# Patient Record
Sex: Female | Born: 1955 | Race: Black or African American | Hispanic: No | Marital: Married | Smoking: Never smoker
Health system: Southern US, Community
[De-identification: ages and names within clinical notes are randomized; demographics above are authoritative.]

## PROBLEM LIST (undated history)

## (undated) DIAGNOSIS — I517 Cardiomegaly: Secondary | ICD-10-CM

## (undated) DIAGNOSIS — J45909 Unspecified asthma, uncomplicated: Secondary | ICD-10-CM

## (undated) DIAGNOSIS — Z87898 Personal history of other specified conditions: Secondary | ICD-10-CM

## (undated) DIAGNOSIS — K219 Gastro-esophageal reflux disease without esophagitis: Secondary | ICD-10-CM

## (undated) DIAGNOSIS — G8929 Other chronic pain: Secondary | ICD-10-CM

## (undated) DIAGNOSIS — I10 Essential (primary) hypertension: Secondary | ICD-10-CM

## (undated) DIAGNOSIS — D649 Anemia, unspecified: Secondary | ICD-10-CM

## (undated) DIAGNOSIS — E669 Obesity, unspecified: Secondary | ICD-10-CM

## (undated) DIAGNOSIS — K8020 Calculus of gallbladder without cholecystitis without obstruction: Secondary | ICD-10-CM

## (undated) DIAGNOSIS — E785 Hyperlipidemia, unspecified: Secondary | ICD-10-CM

## (undated) DIAGNOSIS — R1013 Epigastric pain: Principal | ICD-10-CM

## (undated) HISTORY — PX: KNEE ARTHROSCOPY: SUR90

## (undated) HISTORY — DX: Other chronic pain: G89.29

## (undated) HISTORY — DX: Cardiomegaly: I51.7

## (undated) HISTORY — DX: Personal history of other specified conditions: Z87.898

## (undated) HISTORY — DX: Epigastric pain: R10.13

## (undated) HISTORY — PX: ABDOMINAL HYSTERECTOMY: SHX81

## (undated) HISTORY — DX: Obesity, unspecified: E66.9

## (undated) HISTORY — DX: Hyperlipidemia, unspecified: E78.5

## (undated) HISTORY — DX: Unspecified asthma, uncomplicated: J45.909

## (undated) HISTORY — PX: KNEE SURGERY: SHX244

## (undated) HISTORY — DX: Essential (primary) hypertension: I10

## (undated) HISTORY — DX: Calculus of gallbladder without cholecystitis without obstruction: K80.20

## (undated) HISTORY — PX: TONSILLECTOMY: SUR1361

## (undated) HISTORY — PX: APPENDECTOMY: SHX54

---

## 1988-10-13 HISTORY — PX: ABDOMINAL HYSTERECTOMY: SHX81

## 1999-02-11 ENCOUNTER — Other Ambulatory Visit: Admission: RE | Admit: 1999-02-11 | Discharge: 1999-02-11 | Payer: Self-pay | Admitting: *Deleted

## 1999-03-22 ENCOUNTER — Encounter: Payer: Self-pay | Admitting: *Deleted

## 1999-03-22 ENCOUNTER — Ambulatory Visit (HOSPITAL_COMMUNITY): Admission: RE | Admit: 1999-03-22 | Discharge: 1999-03-22 | Payer: Self-pay | Admitting: *Deleted

## 2002-07-25 ENCOUNTER — Ambulatory Visit (HOSPITAL_COMMUNITY): Admission: RE | Admit: 2002-07-25 | Discharge: 2002-07-25 | Payer: Self-pay | Admitting: Family Medicine

## 2002-07-25 ENCOUNTER — Encounter: Payer: Self-pay | Admitting: Family Medicine

## 2003-12-05 ENCOUNTER — Encounter: Admission: RE | Admit: 2003-12-05 | Discharge: 2003-12-05 | Payer: Self-pay | Admitting: Family Medicine

## 2004-06-26 ENCOUNTER — Emergency Department (HOSPITAL_COMMUNITY): Admission: EM | Admit: 2004-06-26 | Discharge: 2004-06-26 | Payer: Self-pay | Admitting: Emergency Medicine

## 2006-07-07 ENCOUNTER — Ambulatory Visit (HOSPITAL_COMMUNITY): Admission: RE | Admit: 2006-07-07 | Discharge: 2006-07-07 | Payer: Self-pay | Admitting: Internal Medicine

## 2008-01-19 ENCOUNTER — Encounter: Admission: RE | Admit: 2008-01-19 | Discharge: 2008-01-19 | Payer: Self-pay | Admitting: Obstetrics and Gynecology

## 2009-05-13 ENCOUNTER — Emergency Department (HOSPITAL_COMMUNITY): Admission: EM | Admit: 2009-05-13 | Discharge: 2009-05-13 | Payer: Self-pay | Admitting: Emergency Medicine

## 2010-11-03 ENCOUNTER — Encounter: Payer: Self-pay | Admitting: Orthopedic Surgery

## 2010-11-03 ENCOUNTER — Encounter: Payer: Self-pay | Admitting: Obstetrics and Gynecology

## 2011-02-04 ENCOUNTER — Other Ambulatory Visit: Payer: Self-pay | Admitting: Obstetrics and Gynecology

## 2011-02-04 ENCOUNTER — Telehealth: Payer: Self-pay

## 2011-02-04 DIAGNOSIS — Z139 Encounter for screening, unspecified: Secondary | ICD-10-CM

## 2011-02-04 NOTE — Telephone Encounter (Signed)
[place]

## 2011-02-04 NOTE — Telephone Encounter (Signed)
Gastroenterology Pre-Procedure Form  Request Date: 02/03/2011    Requesting Physician: Mirna Mires     PATIENT INFORMATION:  Evelyn Williams is a 55 y.o., female (DOB=[birth year]).  PROCEDURE: Procedure(s) requested: colonoscopy Procedure Reason: screening for colon cancer  PATIENT REVIEW QUESTIONS: The patient reports the following:   1. Diabetes Melitis: no 2. Joint replacements in the past 12 months: no 3. Major health problems in the past 3 months: no 4. Has an artificial valve or MVP:no 5. Has been advised in past to take antibiotics in advance of a procedure like teeth cleaning: no}    MEDICATIONS & ALLERGIES:    Patient reports the following regarding taking any blood thinners:   Plavix? no Aspirin? Yes Coumadin?  no  Patient confirms/reports the following medications:  Current Outpatient Prescriptions  Medication Sig Dispense Refill  . aspirin 81 MG tablet Take 81 mg by mouth daily.        . calcium carbonate 200 MG capsule 1,000 mg daily.        Marland Kitchen losartan-hydrochlorothiazide (HYZAAR) 100-25 MG per tablet Take 1 tablet by mouth daily.        . Multiple Vitamin (MULTIVITAMIN) tablet Take 1 tablet by mouth daily.          Patient confirms/reports the following allergies:  Allergies  Allergen Reactions  . Dye Fdc Red (Fdc Red) Anaphylaxis    CONTRAST DYE/ LUNGS CLOSE UP  . Codeine Itching    Patient is appropriate to schedule for requested procedure(s): yes  AUTHORIZATION INFORMATION Primary Insurance: BCBS   ID #: [id number]  Group #:  Pre-Cert / Berkley Harvey required: Pre-Cert / Berkley Harvey #:   Secondary Insurance Pre-Cert / Auth required: Pre-Cert / Auth #:  Orders Placed This Encounter  Procedures  . Endoscopy, colon, diagnostic    Standing Status: Future     Number of Occurrences:      Standing Expiration Date: 02/04/2012    Order Specific Question:  Pre-op diagnosis    Answer:  SCREENING COLONOSCOPY    Order Specific Question:  Pre-op visit required?   Answer:  No [0]    SCHEDULE INFORMATION: Procedure has been scheduled as follows:  Date: 03/04/2011   Time: 10:30 am  Location: [facility] Short Stay  This Gastroenterology Pre-Precedure Form is being routed to the following provider(s) for review: Jonette Eva, MD

## 2011-02-05 ENCOUNTER — Other Ambulatory Visit: Payer: Self-pay | Admitting: Family Medicine

## 2011-02-05 DIAGNOSIS — N644 Mastodynia: Secondary | ICD-10-CM

## 2011-02-07 ENCOUNTER — Encounter: Payer: Self-pay | Admitting: Gastroenterology

## 2011-02-26 ENCOUNTER — Ambulatory Visit
Admission: RE | Admit: 2011-02-26 | Discharge: 2011-02-26 | Disposition: A | Discharge status: Home / Self Care | Payer: BC Managed Care – PPO | Source: Ambulatory Visit | Attending: Family Medicine | Admitting: Family Medicine

## 2011-02-26 DIAGNOSIS — N644 Mastodynia: Secondary | ICD-10-CM

## 2011-03-04 ENCOUNTER — Ambulatory Visit (HOSPITAL_COMMUNITY)
Admission: RE | Admit: 2011-03-04 | Discharge: 2011-03-04 | Disposition: A | Discharge status: Home / Self Care | Payer: BC Managed Care – PPO | Source: Ambulatory Visit | Attending: Gastroenterology | Admitting: Gastroenterology

## 2011-03-04 ENCOUNTER — Encounter: Payer: BC Managed Care – PPO | Admitting: Gastroenterology

## 2011-03-04 DIAGNOSIS — E785 Hyperlipidemia, unspecified: Secondary | ICD-10-CM | POA: Insufficient documentation

## 2011-03-04 DIAGNOSIS — K648 Other hemorrhoids: Secondary | ICD-10-CM

## 2011-03-04 DIAGNOSIS — I10 Essential (primary) hypertension: Secondary | ICD-10-CM | POA: Insufficient documentation

## 2011-03-04 DIAGNOSIS — Z1211 Encounter for screening for malignant neoplasm of colon: Secondary | ICD-10-CM | POA: Insufficient documentation

## 2011-03-04 HISTORY — PX: COLONOSCOPY: SHX174

## 2011-04-09 NOTE — Op Note (Signed)
  NAME:  Evelyn Williams, Evelyn Williams               ACCOUNT NO.:  [id number]  MEDICAL RECORD NO.:  [id number]           PATIENT TYPE:  O  LOCATION:  [facility]                          FACILITY:  [facility]  PHYSICIAN:  Jonette Eva, M.D.     DATE OF BIRTH:  [birth year]  DATE OF PROCEDURE:  03/04/2011 DATE OF DISCHARGE:                               PROCEDURE NOTE   REFERRING PHYSICIAN:  Annia Friendly. Hill, MD  PROCEDURE:  Colonoscopy.  INDICATION FOR EXAM:  Evelyn Williams is a 55 year old female who presents for average risk colon cancer screening.  FINDINGS: 1. Redundant transverse colon.  Otherwise, no polyps, masses,     inflammatory changes, diverticula, or AVMs seen. 2. Small internal hemorrhoids.  Otherwise, normal retroflex view of     the rectum.  RECOMMENDATIONS: 1. Screening colonoscopy in 10 years WITH OVERTUBE. 2. She should drink 6-8 cups of water daily.  She should follow a high-     fiber diet.  She was given a handout on high-fiber diet,     constipation, and hemorrhoids. 3. She should do Kegel exercises 10 times a day to help with     strengthening her pelvic floor and difficulty with stool     evacuation.  MEDICATIONS: 1. Demerol 100 mg IV. 2. Versed 5 mg IV.  PROCEDURE TECHNIQUE:  Physical exam was performed.  Informed consent was obtained from the patient after explaining the benefits, risks, and alternatives to the procedure.  The patient was connected to the monitor and placed in the left lateral position.  Continuous oxygen was provided by nasal cannula and IV medicine was administered through an indwelling cannula.  After administration of sedation and rectal exam, the patient's rectum was intubated and the scope was advanced under direct visualization to the cecum.  The scope was removed slowly by carefully examining the color, texture, anatomy, and integrity of the mucosa on the way out. The patient was recovered in endoscopy and discharged home in satisfactory  condition.     Jonette Eva, M.D.     SF/MEDQ  D:  03/04/2011  T:  03/05/2011  Job:  [id number]  cc:   Annia Friendly. Loleta Chance, MD Fax: [phone]  Electronically Signed by Jonette Eva M.D. on 04/09/2011 01:38:14 PM

## 2012-02-25 ENCOUNTER — Other Ambulatory Visit: Payer: Self-pay | Admitting: Family Medicine

## 2012-02-25 DIAGNOSIS — I10 Essential (primary) hypertension: Secondary | ICD-10-CM

## 2012-03-03 ENCOUNTER — Other Ambulatory Visit: Payer: BC Managed Care – PPO

## 2012-11-22 ENCOUNTER — Encounter: Payer: Self-pay | Admitting: Gastroenterology

## 2012-11-24 ENCOUNTER — Ambulatory Visit: Payer: BC Managed Care – PPO | Admitting: Gastroenterology

## 2013-07-05 ENCOUNTER — Other Ambulatory Visit: Payer: Self-pay | Admitting: Gastroenterology

## 2013-07-05 ENCOUNTER — Encounter (HOSPITAL_COMMUNITY): Payer: Self-pay | Admitting: Pharmacy Technician

## 2013-07-05 ENCOUNTER — Encounter: Payer: Self-pay | Admitting: Gastroenterology

## 2013-07-05 ENCOUNTER — Ambulatory Visit (INDEPENDENT_AMBULATORY_CARE_PROVIDER_SITE_OTHER): Payer: BC Managed Care – PPO | Admitting: Gastroenterology

## 2013-07-05 VITALS — BP 132/81 | HR 91 | Temp 97.6°F | Ht 65.5 in | Wt 249.6 lb

## 2013-07-05 DIAGNOSIS — R1013 Epigastric pain: Secondary | ICD-10-CM

## 2013-07-05 DIAGNOSIS — G8929 Other chronic pain: Secondary | ICD-10-CM

## 2013-07-05 HISTORY — DX: Other chronic pain: G89.29

## 2013-07-05 NOTE — Progress Notes (Signed)
Reminder in epic °

## 2013-07-05 NOTE — Progress Notes (Signed)
CC'd to PCP 

## 2013-07-05 NOTE — Progress Notes (Signed)
Subjective:    Patient ID: Evelyn Williams, female    DOB: 11/15/1955, 57 y.o.   MRN: [id number]  HILL,GERALD K, MD  HPI ACHING IN EPIGASTRIUM. SX FOR 1 YEAR. USU OCCURS IN THE AM. WAS EVERY AM AND EVERY NIGHT. SX NOW 2-3 DAYS A WEEK. TRIGGERS: ? SPICY FOODS. DRINKS A LOT OF MILK. CAN ONLY EAT HOT WINGS. SPICY FOODS BOTHER HER. WHEN SHE WAKES UP IN THE AM. BETTER WITH MASSAGING UPPER ABD FAT OR LAYING ON HER RIGHT SIDE OR LIFTING HER BREAST, PASSING GAS, OR EATING.  NEVER TRIED PPI. NO WEIGHT LOSS. MOM TOOK MANY PILLS AND SO LIKES TO MINIMIZE THE PILLS. NO EVALUATION FOR SX.HAS NAGGING FEELING IN HER ABD TODAY.ASA QD. IBUPROFEN/MOTRIN: < 1X/MO. MAY DRINK A BEER TO FLUSH HER KIDNEYS. NO BC/GOODY POWDERS, OR NAPROXEN/ALEVE. RED WINE CAUSES NASAL CONGESTION. HAS HAD A FLARE IN HEMORRHOIDS ONE TIME SINCE TCS. NEVER HAS REGULAR BMs. DIARRHEA: 1X/YR. WOKE UP AT THE END OF HER TCS. SEEING HER KIDNEY MD FOR DECREASED SX OF URINATING, GOING MORE OFTEN, AND NOT EMPTYING BLADDER. GOING THROUGH THE CHANGE. MOSITURIZING LOTION CAUSES HER BREAK OUT IN SWEATS. PT DENIES FEVER, CHILLS, BRBPR, nausea, vomiting, melena, problems swallowing, CHEST PAIN, OR problems with sedation. NOT REALLY: heartburn or indigestion. FEELS SOB WHEN SHE CLIMBS STAIRS WHICH SHE ATTRIBUTES TO WEIGHT.  HER COUSIN IS [facility].  Past Medical History  Diagnosis Date  . HTN (hypertension)   . H/O urinary retention    Past Surgical History  Procedure Laterality Date  . Colonoscopy  03/04/2011    SLF:Small internal hemorrhoids/Redundant transverse colon  . Appendectomy    . Tonsillectomy    . Knee surgery      both  . Abdominal hysterectomy     Allergies  Allergen Reactions  . Dye Fdc Red [Dye Fdc Red 3 (Erythrosine)] Anaphylaxis    CONTRAST DYE/ LUNGS CLOSE UP SHELL FISH  . Codeine Itching    Current Outpatient Prescriptions  Medication Sig Dispense Refill  . aspirin 81 MG tablet Take 81 mg by mouth daily.        .  losartan-hydrochlorothiazide (HYZAAR) 100-25 MG per tablet Take 1 tablet by mouth daily.        . Multiple Vitamin (MULTIVITAMIN) tablet Take 1 tablet by mouth daily.        .          Family History  Problem Relation Age of Onset  . Colon cancer Neg Hx   . Colon polyps Neg Hx     History  Substance Use Topics  . Smoking status: Never Smoker   . Smokeless tobacco: Not on file  . Alcohol Use: No   Review of Systems PER HPI OTHERWISE ALL SYSTEMS ARE NEGATIVE.     Objective:   Physical Exam  Vitals reviewed. Constitutional: She is oriented to person, place, and time. She appears well-nourished. No distress.  HENT:  Head: Normocephalic and atraumatic.  Mouth/Throat: Oropharynx is clear and moist. No oropharyngeal exudate.  Eyes: Pupils are equal, round, and reactive to light. No scleral icterus.  Neck: Normal range of motion. Neck supple.  Cardiovascular: Normal rate, regular rhythm and normal heart sounds.   Pulmonary/Chest: Effort normal and breath sounds normal. No respiratory distress.  Abdominal: Soft. Bowel sounds are normal. She exhibits no distension. There is no tenderness.  Musculoskeletal: Normal range of motion. She exhibits no edema.  Lymphadenopathy:    She has no cervical adenopathy.  Neurological: She is alert and   oriented to person, place, and time.  NO FOCAL DEFICITS   Psychiatric: She has a normal mood and affect.          Assessment & Plan:   

## 2013-07-05 NOTE — Addendum Note (Signed)
Addended by: [place] on: 07/05/2013 11:56 AM   Modules accepted: Level of Service

## 2013-07-05 NOTE — Patient Instructions (Addendum)
UPPER ENDOSCOPY SEP 29 AT 2 PM. IF WE HAVE A CANCELLATION, WE WILL CALL YOU TO COME IN SOONER.

## 2013-07-05 NOTE — Assessment & Plan Note (Addendum)
MOST LIKELY DUE TO H PYLORI GASTRITIS, DOUBT PUD,UNCONTROLLED GERD, OR GASTRIC CA.  EGD SEP 29. CLEAR LIQUID BREAKFAST. NOTHING AFTER 9 AM. WILL LIKELY NEED BID PPI FOR 3 MOS THEN PRN. EXPLAINED TO PT & SHE IS AGREEABLE. EXPLAINED USU. NO SIDE EFFECTS WHEN MED TAKEN SHORT TERM. CONSIDER CT ABD WITH PREMED IF NO H PYLORI GASTRITIS. OPV IN 4 MOS

## 2013-07-11 ENCOUNTER — Encounter (HOSPITAL_COMMUNITY)
Admission: RE | Disposition: A | Discharge status: Home / Self Care | Payer: Self-pay | Source: Ambulatory Visit | Attending: Gastroenterology

## 2013-07-11 ENCOUNTER — Encounter (HOSPITAL_COMMUNITY): Payer: Self-pay

## 2013-07-11 ENCOUNTER — Ambulatory Visit (HOSPITAL_COMMUNITY)
Admission: RE | Admit: 2013-07-11 | Discharge: 2013-07-11 | Disposition: A | Discharge status: Home / Self Care | Payer: BC Managed Care – PPO | Source: Ambulatory Visit | Attending: Gastroenterology | Admitting: Gastroenterology

## 2013-07-11 DIAGNOSIS — K2940 Chronic atrophic gastritis without bleeding: Secondary | ICD-10-CM | POA: Insufficient documentation

## 2013-07-11 DIAGNOSIS — K2970 Gastritis, unspecified, without bleeding: Secondary | ICD-10-CM

## 2013-07-11 DIAGNOSIS — K3189 Other diseases of stomach and duodenum: Secondary | ICD-10-CM | POA: Insufficient documentation

## 2013-07-11 DIAGNOSIS — K2990 Gastroduodenitis, unspecified, without bleeding: Secondary | ICD-10-CM

## 2013-07-11 DIAGNOSIS — I10 Essential (primary) hypertension: Secondary | ICD-10-CM | POA: Insufficient documentation

## 2013-07-11 DIAGNOSIS — R1013 Epigastric pain: Secondary | ICD-10-CM

## 2013-07-11 HISTORY — PX: ESOPHAGOGASTRODUODENOSCOPY: SHX5428

## 2013-07-11 SURGERY — EGD (ESOPHAGOGASTRODUODENOSCOPY)
Anesthesia: Moderate Sedation

## 2013-07-11 MED ORDER — MEPERIDINE HCL 100 MG/ML IJ SOLN
INTRAMUSCULAR | Status: DC | PRN
  Administered 2013-07-11 (×4): 25 mg via INTRAVENOUS

## 2013-07-11 MED ORDER — SODIUM CHLORIDE 0.9 % IV SOLN
INTRAVENOUS | Status: DC
Start: 2013-07-11 — End: 2013-07-11
  Administered 2013-07-11: 13:00:00 via INTRAVENOUS

## 2013-07-11 MED ORDER — MIDAZOLAM HCL 5 MG/5ML IJ SOLN
INTRAMUSCULAR | Status: AC
  Filled 2013-07-11: qty 10

## 2013-07-11 MED ORDER — MIDAZOLAM HCL 5 MG/5ML IJ SOLN
INTRAMUSCULAR | Status: DC | PRN
  Administered 2013-07-11: 1 mg via INTRAVENOUS
  Administered 2013-07-11: 2 mg via INTRAVENOUS
  Administered 2013-07-11: 1 mg via INTRAVENOUS
  Administered 2013-07-11: 2 mg via INTRAVENOUS

## 2013-07-11 MED ORDER — BUTAMBEN-TETRACAINE-BENZOCAINE 2-2-14 % EX AERO
INHALATION_SPRAY | CUTANEOUS | Status: DC | PRN
  Administered 2013-07-11: 1 via TOPICAL

## 2013-07-11 MED ORDER — MEPERIDINE HCL 100 MG/ML IJ SOLN
INTRAMUSCULAR | Status: AC
  Filled 2013-07-11: qty 2

## 2013-07-11 MED ORDER — OMEPRAZOLE 20 MG PO CPDR: DELAYED_RELEASE_CAPSULE | ORAL | Status: DC

## 2013-07-11 MED ORDER — STERILE WATER FOR IRRIGATION IR SOLN
Status: DC | PRN
  Administered 2013-07-11: 14:00:00

## 2013-07-11 NOTE — H&P (View-Only) (Signed)
Subjective:    Patient ID: Evelyn Williams, female    DOB: Mar 17, 1956, 57 y.o.   MRN: [id number]  Evelyn Courier, MD  HPI ACHING IN EPIGASTRIUM. SX FOR 1 YEAR. USU OCCURS IN THE AM. WAS EVERY AM AND EVERY NIGHT. SX NOW 2-3 DAYS A WEEK. TRIGGERS: ? SPICY FOODS. DRINKS A LOT OF MILK. CAN ONLY EAT HOT WINGS. SPICY FOODS BOTHER HER. WHEN SHE WAKES UP IN THE AM. BETTER WITH MASSAGING UPPER ABD FAT OR LAYING ON HER RIGHT SIDE OR LIFTING HER BREAST, PASSING GAS, OR EATING.  NEVER TRIED PPI. NO WEIGHT LOSS. MOM TOOK MANY PILLS AND SO LIKES TO MINIMIZE THE PILLS. NO EVALUATION FOR SX.HAS NAGGING FEELING IN HER ABD TODAY.ASA QD. IBUPROFEN/MOTRIN: < 1X/MO. MAY DRINK A BEER TO FLUSH HER KIDNEYS. NO BC/GOODY POWDERS, OR NAPROXEN/ALEVE. RED WINE CAUSES NASAL CONGESTION. HAS HAD A FLARE IN HEMORRHOIDS ONE TIME SINCE TCS. NEVER HAS REGULAR BMs. DIARRHEA: 1X/YR. WOKE UP AT THE END OF HER TCS. SEEING HER KIDNEY MD FOR DECREASED SX OF URINATING, GOING MORE OFTEN, AND NOT EMPTYING BLADDER. GOING THROUGH THE CHANGE. MOSITURIZING LOTION CAUSES HER BREAK OUT IN SWEATS. PT DENIES FEVER, CHILLS, BRBPR, nausea, vomiting, melena, problems swallowing, CHEST PAIN, OR problems with sedation. NOT REALLY: heartburn or indigestion. FEELS SOB WHEN SHE CLIMBS STAIRS WHICH SHE ATTRIBUTES TO WEIGHT.  HER COUSIN IS [facility].  Past Medical History  Diagnosis Date  . HTN (hypertension)   . H/O urinary retention    Past Surgical History  Procedure Laterality Date  . Colonoscopy  03/04/2011    WUJ:WJXBJ internal hemorrhoids/Redundant transverse colon  . Appendectomy    . Tonsillectomy    . Knee surgery      both  . Abdominal hysterectomy     Allergies  Allergen Reactions  . Dye Fdc Red [Dye Fdc Red 3 (Erythrosine)] Anaphylaxis    CONTRAST DYE/ LUNGS CLOSE UP SHELL FISH  . Codeine Itching    Current Outpatient Prescriptions  Medication Sig Dispense Refill  . aspirin 81 MG tablet Take 81 mg by mouth daily.        Marland Kitchen  losartan-hydrochlorothiazide (HYZAAR) 100-25 MG per tablet Take 1 tablet by mouth daily.        . Multiple Vitamin (MULTIVITAMIN) tablet Take 1 tablet by mouth daily.        .          Family History  Problem Relation Age of Onset  . Colon cancer Neg Hx   . Colon polyps Neg Hx     History  Substance Use Topics  . Smoking status: Never Smoker   . Smokeless tobacco: Not on file  . Alcohol Use: No   Review of Systems PER HPI OTHERWISE ALL SYSTEMS ARE NEGATIVE.     Objective:   Physical Exam  Vitals reviewed. Constitutional: She is oriented to person, place, and time. She appears well-nourished. No distress.  HENT:  Head: Normocephalic and atraumatic.  Mouth/Throat: Oropharynx is clear and moist. No oropharyngeal exudate.  Eyes: Pupils are equal, round, and reactive to light. No scleral icterus.  Neck: Normal range of motion. Neck supple.  Cardiovascular: Normal rate, regular rhythm and normal heart sounds.   Pulmonary/Chest: Effort normal and breath sounds normal. No respiratory distress.  Abdominal: Soft. Bowel sounds are normal. She exhibits no distension. There is no tenderness.  Musculoskeletal: Normal range of motion. She exhibits no edema.  Lymphadenopathy:    She has no cervical adenopathy.  Neurological: She is alert and  oriented to person, place, and time.  NO FOCAL DEFICITS   Psychiatric: She has a normal mood and affect.          Assessment & Plan:

## 2013-07-11 NOTE — Op Note (Signed)
[facility] [address] [place], [address]   ENDOSCOPY PROCEDURE REPORT  PATIENT: Evelyn Williams, Evelyn Williams  MR#: [id number] BIRTHDATE: Nov 10, 1955 , 57  yrs. old GENDER: Female  ENDOSCOPIST: Jonette Eva, MD REFERRED [id number] Hill, M.D.  PROCEDURE DATE: 07/11/2013 PROCEDURE:   EGD w/ biopsy  INDICATIONS:Dyspepsia. MEDICATIONS: Demerol 100 mg IV and Versed 6 mg IV TOPICAL ANESTHETIC:   Cetacaine Spray  DESCRIPTION OF PROCEDURE:     Physical exam was performed.  Informed consent was obtained from the patient after explaining the benefits, risks, and alternatives to the procedure.  The patient was connected to the monitor and placed in the left lateral position.  Continuous oxygen was provided by nasal cannula and IV medicine administered through an indwelling cannula.  After administration of sedation, the patients esophagus was intubated and the EG-[id number] ([id number])  endoscope was advanced under direct visualization to the second portion of the duodenum.  The scope was removed slowly by carefully examining the color, texture, anatomy, and integrity of the mucosa on the way out.  The patient was recovered in endoscopy and discharged home in satisfactory condition.   ESOPHAGUS: The mucosa of the esophagus appeared normal.   STOMACH: Mild non-erosive gastritis (inflammation) was found in the gastric antrum.  Multiple biopsies were performed using cold forceps. DUODENUM: The duodenal mucosa showed no abnormalities in the bulb and second portion of the duodenum.  COMPLICATIONS:   None  ENDOSCOPIC IMPRESSION: 1.   MILD Non-erosive gastritis  RECOMMENDATIONS: TAKE OMEPRAZOLE 30 MINUTES PRIOR TO FIRST MEAL once DAILY for 3 mos. AVOID TRIGGERS FOR GASTRITIS. FOLLOW A LOW FAT DIET.  FOLLOW UP IN JAN 2015.   REPEAT EXAM:   _______________________________ Rosalie DoctorJonette Eva, MD 07/11/2013 2:30 PM

## 2013-07-11 NOTE — Interval H&P Note (Signed)
History and Physical Interval Note:  07/11/2013 1:54 PM  Evelyn Williams  has presented today for surgery, with the diagnosis of DYSPEPSIA  The various methods of treatment have been discussed with the patient and family. After consideration of risks, benefits and other options for treatment, the patient has consented to  Procedure(s) with comments: ESOPHAGOGASTRODUODENOSCOPY (EGD) (N/A) - 1:45 as a surgical intervention .  The patient's history has been reviewed, patient examined, no change in status, stable for surgery.  I have reviewed the patient's chart and labs.  Questions were answered to the patient's satisfaction.     [employer]

## 2013-07-14 ENCOUNTER — Encounter (HOSPITAL_COMMUNITY): Payer: Self-pay | Admitting: Gastroenterology

## 2013-07-20 ENCOUNTER — Telehealth: Payer: Self-pay

## 2013-07-20 NOTE — Telephone Encounter (Signed)
Please call pt. Evelyn Williams stomach Bx shows MILD gastritis DUE TO ASA USE.   SHE SHOULD COMPLETE AN ABDOMINAL ULTRASOUND, DX: EPIGASTRIC PAIN. STOP OMEPRAZOLE IF IT IS CAUSING ABDOMINAL PAIN. AVOID TRIGGERS FOR GASTRITIS.  FOLLOW A LOW FAT DIET.  FOLLOW UP IN JAN 2015 E30 ABDOMINAL PAIN.

## 2013-07-20 NOTE — Telephone Encounter (Signed)
Pt called and said she had her EGD and she was told to take Prilosec. She said she had a lot of abdominal pain after she started it. She said she read the side effects, and since her abdominal pain was worse after starting the Prilosec, she would just like to try to watch her diet and see how she does. She just wanted to let Dr. Darrick Penna know what she wants to do and see if that is OK with her.

## 2013-07-21 ENCOUNTER — Other Ambulatory Visit: Payer: Self-pay | Admitting: Gastroenterology

## 2013-07-21 DIAGNOSIS — R1013 Epigastric pain: Secondary | ICD-10-CM

## 2013-07-21 NOTE — Telephone Encounter (Signed)
Patient needs U/S done on Friday 17th at 8:00 and she is aware

## 2013-07-21 NOTE — Telephone Encounter (Signed)
Abdominal U/S scheduled for Thursday October 16th at 8:00 am NPO after midnight

## 2013-07-21 NOTE — Telephone Encounter (Signed)
Pt aware of results 

## 2013-07-22 NOTE — Telephone Encounter (Signed)
Pt is aware of OV on 1/7 at 0900 with SF in E30

## 2013-07-28 ENCOUNTER — Other Ambulatory Visit (HOSPITAL_COMMUNITY): Payer: BC Managed Care – PPO

## 2013-07-29 ENCOUNTER — Ambulatory Visit (HOSPITAL_COMMUNITY)
Admission: RE | Admit: 2013-07-29 | Discharge: 2013-07-29 | Disposition: A | Discharge status: Home / Self Care | Payer: BC Managed Care – PPO | Source: Ambulatory Visit | Attending: Gastroenterology | Admitting: Gastroenterology

## 2013-07-29 DIAGNOSIS — K8020 Calculus of gallbladder without cholecystitis without obstruction: Secondary | ICD-10-CM | POA: Insufficient documentation

## 2013-07-29 DIAGNOSIS — R1013 Epigastric pain: Secondary | ICD-10-CM

## 2013-07-29 DIAGNOSIS — I10 Essential (primary) hypertension: Secondary | ICD-10-CM | POA: Insufficient documentation

## 2013-08-03 NOTE — Telephone Encounter (Signed)
Called patient TO DISCUSS RESULTS. LVM-CALL [phone] TO DISCUSS. Large gallstone in gallbaldder. Needs to discuss benefits v. risks of a cholecystectomy.

## 2013-08-08 NOTE — Telephone Encounter (Signed)
Forwarded  to Dr. Darrick Penna. Pt called and would like to discuss with Dr. Darrick Penna.

## 2013-08-08 NOTE — Telephone Encounter (Addendum)
Called patient TO DISCUSS COMPLAINT. FELT PAIN IN RIGHT SIDE. HAS TO SLEEP ON BACK DUE TO PAIN IN RIGHT SIDE. OFFERED TO MAKE A REFERRAL. PT AWARE SHE SHOULD SPEAK WITH A SURGEON. WOULD LIKE TO PRAY ABOUT IT AND SPEAK WITH DR. HILL. WILL CALL BACK IF SHE NEEDS MY ASSISTANCE. EXPLAINED THE POTENTIAL FOR EXPLOSIVE DIARRHEA IN SOMEPTS THAT HAVE THEIR GB REMOVED, BUT REASSURED HER WE CAN FIX IT IF IT OCCURS.Marland Kitchen

## 2013-10-19 ENCOUNTER — Telehealth: Payer: Self-pay | Admitting: Gastroenterology

## 2013-10-19 ENCOUNTER — Ambulatory Visit: Payer: BC Managed Care – PPO | Admitting: Gastroenterology

## 2013-10-19 NOTE — Telephone Encounter (Addendum)
REVIEWED. CONTACT PT TO RSC.  

## 2013-10-19 NOTE — Telephone Encounter (Signed)
Pt was a no show

## 2014-12-12 DEATH — deceased

## 2016-04-02 ENCOUNTER — Ambulatory Visit (INDEPENDENT_AMBULATORY_CARE_PROVIDER_SITE_OTHER): Payer: BLUE CROSS/BLUE SHIELD | Admitting: Gastroenterology

## 2016-04-02 ENCOUNTER — Other Ambulatory Visit: Payer: Self-pay

## 2016-04-02 ENCOUNTER — Encounter (INDEPENDENT_AMBULATORY_CARE_PROVIDER_SITE_OTHER): Payer: Self-pay

## 2016-04-02 ENCOUNTER — Encounter: Payer: Self-pay | Admitting: Gastroenterology

## 2016-04-02 VITALS — BP 181/95 | HR 78 | Temp 97.2°F | Ht 65.0 in | Wt 257.4 lb

## 2016-04-02 DIAGNOSIS — R1013 Epigastric pain: Principal | ICD-10-CM

## 2016-04-02 DIAGNOSIS — G8929 Other chronic pain: Secondary | ICD-10-CM

## 2016-04-02 NOTE — Patient Instructions (Addendum)
DRINK WATER TO KEEP YOUR URINE LIGHT YELLOW.  FOLLOW A LOW FAT DIET. MEATS SHOULD BE BAKED, BROILED, OR BOILED. AVOID FRIED FOODS. SEE INFO BELOW.   SEE DR. Lovell SheehanJENKINS TO DISCUS GETTING YOUR GALLBLADDER TAKEN OUT.   FOLLOW UP IN 1 YEAR.    Low-Fat Diet BREADS, CEREALS, PASTA, RICE, DRIED PEAS, AND BEANS These products are high in carbohydrates and most are low in fat. Therefore, they can be increased in the diet as substitutes for fatty foods. They too, however, contain calories and should not be eaten in excess. Cereals can be eaten for snacks as well as for breakfast.   FRUITS AND VEGETABLES It is good to eat fruits and vegetables. Besides being sources of fiber, both are rich in vitamins and some minerals. They help you get the daily allowances of these nutrients. Fruits and vegetables can be used for snacks and desserts.  MEATS Limit lean meat, chicken, [place]key, and fish to no more than 6 ounces per day. Beef, Pork, and Lamb Use lean cuts of beef, pork, and lamb. Lean cuts include:  Extra-lean ground beef.  Arm roast.  Sirloin tip.  Center-cut ham.  Round steak.  Loin chops.  Rump roast.  Tenderloin.  Trim all fat off the outside of meats before cooking. It is not necessary to severely decrease the intake of red meat, but lean choices should be made. Lean meat is rich in protein and contains a highly absorbable form of iron. Premenopausal women, in particular, should avoid reducing lean red meat because this could increase the risk for low red blood cells (iron-deficiency anemia).  Chicken and [place]key These are good sources of protein. The fat of poultry can be reduced by removing the skin and underlying fat layers before cooking. Chicken and [place] can be substituted for lean red meat in the diet. Poultry should not be fried or covered with high-fat sauces. Fish and Shellfish Fish is a good source of protein. Shellfish contain cholesterol, but they usually are low in saturated fatty  acids. The preparation of fish is important. Like chicken and [place], they should not be fried or covered with high-fat sauces. EGGS Egg whites contain no fat or cholesterol. They can be eaten often. Try 1 to 2 egg whites instead of whole eggs in recipes or use egg substitutes that do not contain yolk. MILK AND DAIRY PRODUCTS Use skim or 1% milk instead of 2% or whole milk. Decrease whole milk, natural, and processed cheeses. Use nonfat or low-fat (2%) cottage cheese or low-fat cheeses made from vegetable oils. Choose nonfat or low-fat (1 to 2%) yogurt. Experiment with evaporated skim milk in recipes that call for heavy cream. Substitute low-fat yogurt or low-fat cottage cheese for sour cream in dips and salad dressings. Have at least 2 servings of low-fat dairy products, such as 2 glasses of skim (or 1%) milk each day to help get your daily calcium intake. FATS AND OILS Reduce the total intake of fats, especially saturated fat. Butterfat, lard, and beef fats are high in saturated fat and cholesterol. These should be avoided as much as possible. Vegetable fats do not contain cholesterol, but certain vegetable fats, such as coconut oil, palm oil, and palm kernel oil are very high in saturated fats. These should be limited. These fats are often used in bakery goods, processed foods, popcorn, oils, and nondairy creamers. Vegetable shortenings and some peanut butters contain hydrogenated oils, which are also saturated fats. Read the labels on these foods and check for  saturated vegetable oils. Unsaturated vegetable oils and fats do not raise blood cholesterol. However, they should be limited because they are fats and are high in calories. Total fat should still be limited to 30% of your daily caloric intake. Desirable liquid vegetable oils are corn oil, cottonseed oil, olive oil, canola oil, safflower oil, soybean oil, and sunflower oil. Peanut oil is not as good, but small amounts are acceptable. Buy a  heart-healthy tub margarine that has no partially hydrogenated oils in the ingredients. Mayonnaise and salad dressings often are made from unsaturated fats, but they should also be limited because of their high calorie and fat content. Seeds, nuts, peanut butter, olives, and avocados are high in fat, but the fat is mainly the unsaturated type. These foods should be limited mainly to avoid excess calories and fat. OTHER EATING TIPS Snacks  Most sweets should be limited as snacks. They tend to be rich in calories and fats, and their caloric content outweighs their nutritional value. Some good choices in snacks are graham crackers, melba toast, soda crackers, bagels (no egg), English muffins, fruits, and vegetables. These snacks are preferable to snack crackers, [place] fries, TORTILLA CHIPS, and POTATO chips. Popcorn should be air-popped or cooked in small amounts of liquid vegetable oil. Desserts Eat fruit, low-fat yogurt, and fruit ices instead of pastries, cake, and cookies. Sherbet, angel food cake, gelatin dessert, frozen low-fat yogurt, or other frozen products that do not contain saturated fat (pure fruit juice bars, frozen ice pops) are also acceptable.  COOKING METHODS Choose those methods that use little or no fat. They include: Poaching.  Braising.  Steaming.  Grilling.  Baking.  Stir-frying.  Broiling.  Microwaving.  Foods can be cooked in a nonstick pan without added fat, or use a nonfat cooking spray in regular cookware. Limit fried foods and avoid frying in saturated fat. Add moisture to lean meats by using water, broth, cooking wines, and other nonfat or low-fat sauces along with the cooking methods mentioned above. Soups and stews should be chilled after cooking. The fat that forms on top after a few hours in the refrigerator should be skimmed off. When preparing meals, avoid using excess salt. Salt can contribute to raising blood pressure in some people.  EATING AWAY FROM  HOME Order entres, potatoes, and vegetables without sauces or butter. When meat exceeds the size of a deck of cards (3 to 4 ounces), the rest can be taken home for another meal. Choose vegetable or fruit salads and ask for low-calorie salad dressings to be served on the side. Use dressings sparingly. Limit high-fat toppings, such as bacon, crumbled eggs, cheese, sunflower seeds, and olives. Ask for heart-healthy tub margarine instead of butter.

## 2016-04-02 NOTE — Progress Notes (Signed)
Subjective:    Patient ID: Evelyn Williams, female    DOB: [birth year], 60 y.o.   MRN: [id number]  Evelyn CourierGerald K Hill, MD   HPI ATE TACO [facility]AD@ 8 PM. WAS AWAKENED ABOUT 4 AM WITH SEVERE BURNING/ACHING EPIGASTRIC INTO BACK ON BOTH SIDES-PAIN AND IN HER ARMS. SX LASTED ABOUT 6 HRS AND THEN WET AWAIT BUT THE NEXT DAY HAD A SHOOTING TWINGE OF  RUQ. WAS WATCHING WHAT SHE ATE UP. MAY HAVE HEARTBURN/GASTRITIS IF EATS PEPPER. BMS: 3-4X/WEEK. MAY HAVE CONSTPATION. MAY HAVE UP TO 2X/DAY. FAMILY TOLD HER NOT EXERCISE BECAUSE HER BP IS UP. No pain today. PT DENIES FEVER, CHILLS, HEMATOCHEZIA,  nausea, vomiting, melena, diarrhea, SHORTNESS OF BREATH, CHANGE IN BOWEL IN HABITS, OR problems swallowing.   Past Medical History  Diagnosis Date  . HTN (hypertension)   . H/O urinary retention   . Abdominal pain, chronic, epigastric 07/05/2013    Past Surgical History  Procedure Laterality Date  . Colonoscopy  03/04/2011    WUJ:WJXBJSLF:Small internal hemorrhoids/Redundant transverse colon  . Appendectomy    . Tonsillectomy    . Knee surgery      both  . Abdominal hysterectomy    . Esophagogastroduodenoscopy N/A 07/11/2013    Procedure: ESOPHAGOGASTRODUODENOSCOPY (EGD);  Surgeon: West BaliSandi L Richard Holz, MD;  Location: [facility];  Service: Endoscopy;  Laterality: N/A;  1:45   Allergies  Allergen Reactions  . Dye Fdc Red [Dye Fdc Red 3 (Erythrosine)] Anaphylaxis    CONTRAST DYE/ LUNGS CLOSE UP SHELL FISH  . Shellfish Allergy Anaphylaxis  . Codeine Itching  . Prilosec [Omeprazole]     ABDOMINAL PAIN  . Latex Rash   Current Outpatient Prescriptions  Medication Sig Dispense Refill  . aspirin 81 MG tablet Take 81 mg by mouth daily.      . Multiple Vitamin (MULTIVITAMIN) tablet Take 1 tablet by mouth daily.      Marland Kitchen. diltiazem (CARDIZEM CD) 240 MG 24 hr capsule Take 240 mg by mouth daily. Reported on 04/02/2016     Family History  Problem Relation Age of Onset  . Colon cancer Neg Hx   . Colon polyps Neg Hx     Social History   Social History  . Marital Status: Married    Spouse Name: N/A  . Number of Children: N/A  . Years of Education: N/A   Social History Main Topics  . Smoking status: Never Smoker   . Smokeless tobacco: None  . Alcohol Use: No  . Drug Use: No  . Sexual Activity: Not Asked   Other Topics Concern  . None   Social History Narrative   Review of Systems PER HPI OTHERWISE ALL SYSTEMS ARE NEGATIVE.    Objective:   Physical Exam  Constitutional: She is oriented to person, place, and time. She appears well-developed and well-nourished. No distress.  HENT:  Head: Normocephalic and atraumatic.  Mouth/Throat: Oropharynx is clear and moist. No oropharyngeal exudate.  Eyes: Pupils are equal, round, and reactive to light. No scleral icterus.  Neck: Normal range of motion. Neck supple.  Cardiovascular: Normal rate, regular rhythm and normal heart sounds.   Pulmonary/Chest: Effort normal and breath sounds normal. No respiratory distress.  Abdominal: Soft. Bowel sounds are normal. She exhibits no distension. There is no tenderness.  Musculoskeletal: She exhibits no edema.  Lymphadenopathy:    She has no cervical adenopathy.  Neurological: She is alert and oriented to person, place, and time.  Psychiatric: She has a normal mood and affect.  Vitals reviewed.     Assessment & Plan:

## 2016-04-02 NOTE — Progress Notes (Signed)
cc'ed to pcp °

## 2016-04-02 NOTE — Progress Notes (Signed)
ON RECALL  °

## 2016-04-02 NOTE — Assessment & Plan Note (Signed)
SYMPTOMS NOT IDEALLY CONTROLLED.  DRINK WATER TO KEEP YOUR URINE LIGHT YELLOW. FOLLOW A LOW FAT DIET. MEATS SHOULD BE BAKED, BROILED, OR BOILED. AVOID FRIED FOODS.  SEE DR. Lovell SheehanJENKINS TO DISCUS GETTING YOUR GALLBLADDER TAKEN OUT.   FOLLOW UP IN 1 YEAR.

## 2016-05-09 ENCOUNTER — Ambulatory Visit: Payer: Self-pay | Admitting: Gastroenterology

## 2016-05-20 NOTE — H&P (Signed)
  NTS SOAP Note  Vital Signs:  Vitals as of: 05/20/2016: Systolic 152: Diastolic 103: Heart Rate 93: Temp 97.10F (Temporal): Height 275ft 5.5in: Weight 259Lbs 0 Ounces: BMI 42.44   BMI : 42.44 kg/m2  Subjective: This 60 year old female presents for of gallstones.  Has had two episodes of right upper quadrant abdominal pain, nausea, and fatty food intolerance over the past three years.  Seems to have some chronic right sided discomfort when lying on right side.  Last episode of pain six months ago.  Currently is asymptomatic.  No fever, chills, jaundice.  Some foods give her an upset stomach.  Has h/o gastritis as per Dr. Darrick PennaFields of gi.  Review of Symptoms:  Constitutional:negative headache Eyes:negative sinus problems Cardiovascular:negative Respiratory:cough Gastrointestinabdominal pain, nausea, dyspepsia Genitourinary:urinary hesitancy joint pain Skin:negative Hematolgic/Lymphatic:negative Allergic/Immunologic:negative   Past Medical History:Reviewed  Past Medical History  Surgical History: knee surgery, TAH, appendectomy Medical Problems: HTN, morbid obesity Allergies: dye, shellfish, codeine, prilosec, latex Medications: asa, cardizem   Social History:Reviewed  Social History  Preferred Language: English Race:  Black or African American Ethnicity: Not Hispanic / Latino Age: 60 year Marital Status:  M Alcohol: no   Smoking Status: Never smoker reviewed on 05/20/2016 Functional Status reviewed on 05/20/2016 ------------------------------------------------ Bathing: Normal Cooking: Normal Dressing: Normal Driving: Normal Eating: Normal Managing Meds: Normal Oral Care: Normal Shopping: Normal Toileting: Normal Transferring: Normal Walking: Normal Cognitive Status reviewed on 05/20/2016 ------------------------------------------------ Attention: Normal Decision Making: Normal Language: Normal Memory: Normal Motor: Normal Perception:  Normal Problem Solving: Normal Visual and Spatial: Normal   Family History:Reviewed  Family Health History Mother, Healthy;  Father, Healthy;     Objective Information: Obese bf in NAD Head:Atraumatic; no masses; no abnormalities no scleral icterus Neck:Supple without lymphadenopathy.  Heart:RRR, no murmur or gallop.  Normal S1, S2.  No S3, S4.  Lungs:CTA bilaterally, no wheezes, rhonchi, rales.  Breathing unlabored. Abdomen:Soft, slightly tender in right upper quadrant to deep palpation, ND, normal bowel sounds, no HSM, no masses.  No peritoneal signs. U/S shows choleltihiasis, normal common bile duct Assessment:Cholelithiasis  Diagnoses: 574.20  K80.20 Cholelithiasis without obstruction (Calculus of gallbladder without cholecystitis without obstruction)  Procedures: [id number] - OFFICE OUTPATIENT NEW 30 MINUTES    Plan:  Scheduled for laparoscopic cholecystectomy on 05/26/16.  Stop ASA.   Patient Education:Alternative treatments to surgery were discussed with patient (and family).Risks and benefits  of procedure including bleeding, infection, hepatobiliary injury, and the possiblity of an open procedure were fully explained to the patient (and family) who gave informed consent. Patient/family questions were addressed.  Follow-up:Pending Surgery

## 2016-05-22 NOTE — Patient Instructions (Signed)
Your procedure is scheduled on: 05/26/16  Report to [facility] at  6:15   AM.  Call this number if you have problems the morning of surgery: [phone]   Remember:   Do not drink or eat food:After Midnight.  :  Take these medicines the morning of surgery with A SIP OF WATER: Diltiazem   Do not wear jewelry, make-up or nail polish.  Do not wear lotions, powders, or perfumes. You may wear deodorant.  Do not shave 48 hours prior to surgery. Men may shave face and neck.  Do not bring valuables to the hospital.  Contacts, dentures or bridgework may not be worn into surgery.  Leave suitcase in the car. After surgery it may be brought to your room.  For patients admitted to the hospital, checkout time is 11:00 AM the day of discharge.   Patients discharged the day of surgery will not be allowed to drive home.    Special Instructions: Shower using CHG night before surgery and shower the day of surgery use CHG.  Use special wash - you have one bottle of CHG for all showers.  You should use approximately 1/2 of the bottle for each shower.  Laparoscopic Cholecystectomy, Care After Refer to this sheet in the next few weeks. These instructions provide you with information about caring for yourself after your procedure. Your health care provider may also give you more specific instructions. Your treatment has been planned according to current medical practices, but problems sometimes occur. Call your health care provider if you have any problems or questions after your procedure. WHAT TO EXPECT AFTER THE PROCEDURE After your procedure, it is common to have:  Pain at your incision sites. You will be given pain medicines to control your pain.  Mild nausea or vomiting. This should improve after the first 24 hours.  Bloating and possible shoulder pain from the gas that was used during the procedure. This will improve after the first 24 hours. HOME CARE INSTRUCTIONS Incision Care  Follow  instructions from your health care provider about how to take care of your incisions. Make sure you:  Wash your hands with soap and water before you change your bandage (dressing). If soap and water are not available, use hand sanitizer.  Change your dressing as told by your health care provider.  Leave stitches (sutures), skin glue, or adhesive strips in place. These skin closures may need to be in place for 2 weeks or longer. If adhesive strip edges start to loosen and curl up, you may trim the loose edges. Do not remove adhesive strips completely unless your health care provider tells you to do that.  Do not take baths, swim, or use a hot tub until your health care provider approves. Ask your health care provider if you can take showers. You may only be allowed to take sponge baths for bathing. General Instructions  Take over-the-counter and prescription medicines only as told by your health care provider.  Do not drive or operate heavy machinery while taking prescription pain medicine.  Return to your normal diet as told by your health care provider.  Do not lift anything that is heavier than 10 lb (4.5 kg).  Do not play contact sports for one week or until your health care provider approves. SEEK MEDICAL CARE IF:   You have redness, swelling, or pain at the site of your incision.  You have fluid, blood, or pus coming from your incision.  You notice a bad smell  coming from your incision area.  Your surgical incisions break open.  You have a fever. SEEK IMMEDIATE MEDICAL CARE IF:  You develop a rash.  You have difficulty breathing.  You have chest pain.  You have increasing pain in your shoulders (shoulder strap areas).  You faint or have dizzy episodes while you are standing.  You have severe pain in your abdomen.  You have nausea or vomiting that lasts for more than one day.   This information is not intended to replace advice given to you by your health care  provider. Make sure you discuss any questions you have with your health care provider.   Document Released: 09/29/2005 Document Revised: 06/20/2015 Document Reviewed: 05/11/2013 Elsevier Interactive Patient Education 2016 Elsevier Inc. General Anesthesia, Adult, Care After Refer to this sheet in the next few weeks. These instructions provide you with information on caring for yourself after your procedure. Your health care provider may also give you more specific instructions. Your treatment has been planned according to current medical practices, but problems sometimes occur. Call your health care provider if you have any problems or questions after your procedure. WHAT TO EXPECT AFTER THE PROCEDURE After the procedure, it is typical to experience:  Sleepiness.  Nausea and vomiting. HOME CARE INSTRUCTIONS  For the first 24 hours after general anesthesia:  Have a responsible person with you.  Do not drive a car. If you are alone, do not take public transportation.  Do not drink alcohol.  Do not take medicine that has not been prescribed by your health care provider.  Do not sign important papers or make important decisions.  You may resume a normal diet and activities as directed by your health care provider.  Change bandages (dressings) as directed.  If you have questions or problems that seem related to general anesthesia, call the hospital and ask for the anesthetist or anesthesiologist on call. SEEK MEDICAL CARE IF:  You have nausea and vomiting that continue the day after anesthesia.  You develop a rash. SEEK IMMEDIATE MEDICAL CARE IF:   You have difficulty breathing.  You have chest pain.  You have any allergic problems.   This information is not intended to replace advice given to you by your health care provider. Make sure you discuss any questions you have with your health care provider.   Document Released: 01/05/2001 Document Revised: 10/20/2014 Document  Reviewed: 01/28/2012 Elsevier Interactive Patient Education [employer] Elsevier Inc.

## 2016-05-23 ENCOUNTER — Encounter (HOSPITAL_COMMUNITY)
Admission: RE | Admit: 2016-05-23 | Discharge: 2016-05-23 | Disposition: A | Discharge status: Home / Self Care | Payer: BLUE CROSS/BLUE SHIELD | Source: Ambulatory Visit | Attending: General Surgery | Admitting: General Surgery

## 2016-05-23 ENCOUNTER — Encounter (HOSPITAL_COMMUNITY): Payer: Self-pay

## 2016-05-23 DIAGNOSIS — Z888 Allergy status to other drugs, medicaments and biological substances status: Secondary | ICD-10-CM | POA: Diagnosis not present

## 2016-05-23 DIAGNOSIS — K8010 Calculus of gallbladder with chronic cholecystitis without obstruction: Secondary | ICD-10-CM | POA: Diagnosis not present

## 2016-05-23 DIAGNOSIS — D649 Anemia, unspecified: Secondary | ICD-10-CM | POA: Diagnosis not present

## 2016-05-23 DIAGNOSIS — Z91040 Latex allergy status: Secondary | ICD-10-CM | POA: Diagnosis not present

## 2016-05-23 DIAGNOSIS — Z6841 Body Mass Index (BMI) 40.0 and over, adult: Secondary | ICD-10-CM | POA: Diagnosis not present

## 2016-05-23 DIAGNOSIS — I10 Essential (primary) hypertension: Secondary | ICD-10-CM | POA: Diagnosis not present

## 2016-05-23 DIAGNOSIS — K8064 Calculus of gallbladder and bile duct with chronic cholecystitis without obstruction: Secondary | ICD-10-CM | POA: Diagnosis present

## 2016-05-23 DIAGNOSIS — Z885 Allergy status to narcotic agent status: Secondary | ICD-10-CM | POA: Diagnosis not present

## 2016-05-23 DIAGNOSIS — K219 Gastro-esophageal reflux disease without esophagitis: Secondary | ICD-10-CM | POA: Diagnosis not present

## 2016-05-23 DIAGNOSIS — Z91013 Allergy to seafood: Secondary | ICD-10-CM | POA: Diagnosis not present

## 2016-05-23 HISTORY — DX: Gastro-esophageal reflux disease without esophagitis: K21.9

## 2016-05-23 HISTORY — DX: Essential (primary) hypertension: I10

## 2016-05-23 HISTORY — DX: Anemia, unspecified: D64.9

## 2016-05-23 LAB — CBC WITH DIFFERENTIAL/PLATELET
Basophils Absolute: 0 10*3/uL (ref 0.0–0.1)
Basophils Relative: 0 %
Eosinophils Absolute: 0.3 10*3/uL (ref 0.0–0.7)
Eosinophils Relative: 4 %
HCT: 41.3 % (ref 36.0–46.0)
Hemoglobin: 13 g/dL (ref 12.0–15.0)
Lymphocytes Relative: 25 %
Lymphs Abs: 2.1 10*3/uL (ref 0.7–4.0)
MCH: 24.8 pg — ABNORMAL LOW (ref 26.0–34.0)
MCHC: 31.5 g/dL (ref 30.0–36.0)
MCV: 78.8 fL (ref 78.0–100.0)
Monocytes Absolute: 0.6 10*3/uL (ref 0.1–1.0)
Monocytes Relative: 7 %
Neutro Abs: 5.4 10*3/uL (ref 1.7–7.7)
Neutrophils Relative %: 64 %
Platelets: 317 10*3/uL (ref 150–400)
RBC: 5.24 MIL/uL — ABNORMAL HIGH (ref 3.87–5.11)
RDW: 12.8 % (ref 11.5–15.5)
WBC: 8.5 10*3/uL (ref 4.0–10.5)

## 2016-05-23 LAB — BASIC METABOLIC PANEL
Anion gap: 6 (ref 5–15)
BUN: 11 mg/dL (ref 6–20)
CO2: 25 mmol/L (ref 22–32)
Calcium: 8.8 mg/dL — ABNORMAL LOW (ref 8.9–10.3)
Chloride: 106 mmol/L (ref 101–111)
Creatinine, Ser: 0.72 mg/dL (ref 0.44–1.00)
GFR calc Af Amer: >60 mL/min (ref >60)
GFR calc non Af Amer: >60 mL/min (ref >60)
Glucose, Bld: 128 mg/dL — ABNORMAL HIGH (ref 65–99)
Potassium: 4 mmol/L (ref 3.5–5.1)
Sodium: 137 mmol/L (ref 135–145)

## 2016-05-23 LAB — HEPATIC FUNCTION PANEL
ALT: 13 U/L — ABNORMAL LOW (ref 14–54)
AST: 18 U/L (ref 15–41)
Albumin: 4.2 g/dL (ref 3.5–5.0)
Alkaline Phosphatase: 86 U/L (ref 38–126)
Bilirubin, Direct: <0.1 mg/dL — ABNORMAL LOW (ref 0.1–0.5)
Total Bilirubin: 0.5 mg/dL (ref 0.3–1.2)
Total Protein: 7.6 g/dL (ref 6.5–8.1)

## 2016-05-23 NOTE — Pre-Procedure Instructions (Signed)
Patient given information to sign up for my chart at home. 

## 2016-05-26 ENCOUNTER — Encounter (HOSPITAL_COMMUNITY)
Admission: RE | Disposition: A | Discharge status: Home / Self Care | Payer: Self-pay | Source: Ambulatory Visit | Attending: General Surgery

## 2016-05-26 ENCOUNTER — Ambulatory Visit (HOSPITAL_COMMUNITY): Payer: BLUE CROSS/BLUE SHIELD | Admitting: Anesthesiology

## 2016-05-26 ENCOUNTER — Ambulatory Visit (HOSPITAL_COMMUNITY)
Admission: RE | Admit: 2016-05-26 | Discharge: 2016-05-26 | Disposition: A | Discharge status: Home / Self Care | Payer: BLUE CROSS/BLUE SHIELD | Source: Ambulatory Visit | Attending: General Surgery | Admitting: General Surgery

## 2016-05-26 ENCOUNTER — Encounter (HOSPITAL_COMMUNITY): Payer: Self-pay | Admitting: *Deleted

## 2016-05-26 DIAGNOSIS — K8010 Calculus of gallbladder with chronic cholecystitis without obstruction: Secondary | ICD-10-CM | POA: Diagnosis not present

## 2016-05-26 DIAGNOSIS — Z888 Allergy status to other drugs, medicaments and biological substances status: Secondary | ICD-10-CM | POA: Insufficient documentation

## 2016-05-26 DIAGNOSIS — D649 Anemia, unspecified: Secondary | ICD-10-CM | POA: Insufficient documentation

## 2016-05-26 DIAGNOSIS — I10 Essential (primary) hypertension: Secondary | ICD-10-CM | POA: Insufficient documentation

## 2016-05-26 DIAGNOSIS — K219 Gastro-esophageal reflux disease without esophagitis: Secondary | ICD-10-CM | POA: Insufficient documentation

## 2016-05-26 DIAGNOSIS — Z6841 Body Mass Index (BMI) 40.0 and over, adult: Secondary | ICD-10-CM | POA: Insufficient documentation

## 2016-05-26 DIAGNOSIS — Z91040 Latex allergy status: Secondary | ICD-10-CM | POA: Insufficient documentation

## 2016-05-26 DIAGNOSIS — Z91013 Allergy to seafood: Secondary | ICD-10-CM | POA: Insufficient documentation

## 2016-05-26 DIAGNOSIS — Z885 Allergy status to narcotic agent status: Secondary | ICD-10-CM | POA: Insufficient documentation

## 2016-05-26 HISTORY — PX: CHOLECYSTECTOMY: SHX55

## 2016-05-26 SURGERY — LAPAROSCOPIC CHOLECYSTECTOMY
Anesthesia: General | Site: Abdomen

## 2016-05-26 MED ORDER — LIDOCAINE HCL 2 % EX GEL: CUTANEOUS | Status: DC | PRN

## 2016-05-26 MED ORDER — ONDANSETRON HCL 4 MG/2ML IJ SOLN
4.0000 mg | Freq: Once | INTRAMUSCULAR | Status: AC
  Administered 2016-05-26: 4 mg via INTRAVENOUS

## 2016-05-26 MED ORDER — ROCURONIUM BROMIDE 50 MG/5ML IV SOLN
INTRAVENOUS | Status: AC
  Filled 2016-05-26: qty 1

## 2016-05-26 MED ORDER — SUGAMMADEX SODIUM 500 MG/5ML IV SOLN
INTRAVENOUS | Status: DC | PRN
  Administered 2016-05-26: 200 mg via INTRAVENOUS

## 2016-05-26 MED ORDER — CIPROFLOXACIN IN D5W 400 MG/200ML IV SOLN
400.0000 mg | INTRAVENOUS | Status: AC
  Administered 2016-05-26: 400 mg via INTRAVENOUS

## 2016-05-26 MED ORDER — SCOPOLAMINE 1 MG/3DAYS TD PT72
1.0000 | MEDICATED_PATCH | Freq: Once | TRANSDERMAL | Status: DC
Start: 2016-05-26 — End: 2016-05-26
  Administered 2016-05-26: 1.5 mg via TRANSDERMAL

## 2016-05-26 MED ORDER — ROCURONIUM BROMIDE 100 MG/10ML IV SOLN
INTRAVENOUS | Status: DC | PRN
  Administered 2016-05-26: 25 mg via INTRAVENOUS

## 2016-05-26 MED ORDER — BUPIVACAINE HCL (PF) 0.5 % IJ SOLN
INTRAMUSCULAR | Status: DC | PRN
  Administered 2016-05-26: 10 mL

## 2016-05-26 MED ORDER — ONDANSETRON HCL 4 MG/2ML IJ SOLN
INTRAMUSCULAR | Status: AC
  Filled 2016-05-26: qty 2

## 2016-05-26 MED ORDER — HYDROMORPHONE HCL 1 MG/ML IJ SOLN
0.2500 mg | INTRAMUSCULAR | Status: DC | PRN
  Administered 2016-05-26 (×4): 0.5 mg via INTRAVENOUS

## 2016-05-26 MED ORDER — LIDOCAINE HCL (PF) 1 % IJ SOLN
INTRAMUSCULAR | Status: DC | PRN
  Administered 2016-05-26: 100 mg

## 2016-05-26 MED ORDER — SODIUM CHLORIDE 0.9 % IR SOLN
Status: DC | PRN
  Administered 2016-05-26: 500 mL

## 2016-05-26 MED ORDER — POVIDONE-IODINE 10 % OINT PACKET
TOPICAL_OINTMENT | CUTANEOUS | Status: DC | PRN
  Administered 2016-05-26: 1 via TOPICAL

## 2016-05-26 MED ORDER — LACTATED RINGERS IV SOLN
INTRAVENOUS | Status: DC
  Administered 2016-05-26: 1000 mL via INTRAVENOUS
  Administered 2016-05-26: 09:00:00 via INTRAVENOUS

## 2016-05-26 MED ORDER — DEXAMETHASONE SODIUM PHOSPHATE 4 MG/ML IJ SOLN
4.0000 mg | Freq: Once | INTRAMUSCULAR | Status: AC
  Administered 2016-05-26: 4 mg via INTRAVENOUS

## 2016-05-26 MED ORDER — HEMOSTATIC AGENTS (NO CHARGE) OPTIME
TOPICAL | Status: DC | PRN
  Administered 2016-05-26: 1 via TOPICAL

## 2016-05-26 MED ORDER — DEXAMETHASONE SODIUM PHOSPHATE 4 MG/ML IJ SOLN
INTRAMUSCULAR | Status: AC
  Filled 2016-05-26: qty 1

## 2016-05-26 MED ORDER — FENTANYL CITRATE (PF) 100 MCG/2ML IJ SOLN
INTRAMUSCULAR | Status: DC | PRN
  Administered 2016-05-26: 100 ug via INTRAVENOUS

## 2016-05-26 MED ORDER — SUGAMMADEX SODIUM 500 MG/5ML IV SOLN
INTRAVENOUS | Status: AC
Start: 2016-05-26 — End: 2016-05-26
  Filled 2016-05-26: qty 5

## 2016-05-26 MED ORDER — HYDROMORPHONE HCL 1 MG/ML IJ SOLN
INTRAMUSCULAR | Status: AC
  Filled 2016-05-26: qty 1

## 2016-05-26 MED ORDER — PROPOFOL 10 MG/ML IV BOLUS
INTRAVENOUS | Status: AC
  Filled 2016-05-26: qty 20

## 2016-05-26 MED ORDER — POVIDONE-IODINE 10 % EX OINT
TOPICAL_OINTMENT | CUTANEOUS | Status: AC
  Filled 2016-05-26: qty 1

## 2016-05-26 MED ORDER — MIDAZOLAM HCL 2 MG/2ML IJ SOLN
INTRAMUSCULAR | Status: AC
  Filled 2016-05-26: qty 2

## 2016-05-26 MED ORDER — PROPOFOL 10 MG/ML IV BOLUS
INTRAVENOUS | Status: DC | PRN
  Administered 2016-05-26: 150 mg via INTRAVENOUS

## 2016-05-26 MED ORDER — SCOPOLAMINE 1 MG/3DAYS TD PT72
MEDICATED_PATCH | TRANSDERMAL | Status: AC
  Filled 2016-05-26: qty 1

## 2016-05-26 MED ORDER — SUCCINYLCHOLINE CHLORIDE 20 MG/ML IJ SOLN
INTRAMUSCULAR | Status: AC
  Filled 2016-05-26: qty 1

## 2016-05-26 MED ORDER — FENTANYL CITRATE (PF) 250 MCG/5ML IJ SOLN
INTRAMUSCULAR | Status: AC
  Filled 2016-05-26: qty 5

## 2016-05-26 MED ORDER — MIDAZOLAM HCL 2 MG/2ML IJ SOLN
1.0000 mg | INTRAMUSCULAR | Status: DC | PRN
  Administered 2016-05-26 (×2): 2 mg via INTRAVENOUS
  Filled 2016-05-26: qty 2

## 2016-05-26 MED ORDER — TRAMADOL HCL 50 MG PO TABS: 50.0000 mg | ORAL_TABLET | Freq: Four times a day (QID) | ORAL | 0 refills | Status: DC | PRN

## 2016-05-26 MED ORDER — SUCCINYLCHOLINE CHLORIDE 20 MG/ML IJ SOLN
INTRAMUSCULAR | Status: DC | PRN
  Administered 2016-05-26: 140 mg via INTRAVENOUS

## 2016-05-26 MED ORDER — CIPROFLOXACIN IN D5W 400 MG/200ML IV SOLN
INTRAVENOUS | Status: AC
  Filled 2016-05-26: qty 200

## 2016-05-26 MED ORDER — BUPIVACAINE HCL (PF) 0.5 % IJ SOLN
INTRAMUSCULAR | Status: AC
  Filled 2016-05-26: qty 30

## 2016-05-26 SURGICAL SUPPLY — 42 items
APPLIER CLIP LAPSCP 10X32 DD (CLIP) ×2 IMPLANT
BAG HAMPER (MISCELLANEOUS) ×2 IMPLANT
CHLORAPREP W/TINT 26ML (MISCELLANEOUS) ×2 IMPLANT
CLOTH BEACON ORANGE TIMEOUT ST (SAFETY) ×2 IMPLANT
COVER LIGHT HANDLE STERIS (MISCELLANEOUS) ×4 IMPLANT
DECANTER SPIKE VIAL GLASS SM (MISCELLANEOUS) ×2 IMPLANT
ELECT REM PT RETURN 9FT ADLT (ELECTROSURGICAL) ×2
ELECTRODE REM PT RTRN 9FT ADLT (ELECTROSURGICAL) ×1 IMPLANT
FILTER SMOKE EVAC LAPAROSHD (FILTER) ×2 IMPLANT
FORMALIN 10 PREFIL 120ML (MISCELLANEOUS) ×2 IMPLANT
GLOVE BIOGEL PI IND STRL 7.0 (GLOVE) ×1 IMPLANT
GLOVE BIOGEL PI IND STRL 8.5 (GLOVE) ×1 IMPLANT
GLOVE BIOGEL PI INDICATOR 7.0 (GLOVE) ×1
GLOVE BIOGEL PI INDICATOR 8.5 (GLOVE) ×1
GLOVE SURG SS PI 7.0 STRL IVOR (GLOVE) ×2 IMPLANT
GLOVE SURG SS PI 7.5 STRL IVOR (GLOVE) ×2 IMPLANT
GOWN STRL REUS W/ TWL XL LVL3 (GOWN DISPOSABLE) ×1 IMPLANT
GOWN STRL REUS W/TWL LRG LVL3 (GOWN DISPOSABLE) ×4 IMPLANT
GOWN STRL REUS W/TWL XL LVL3 (GOWN DISPOSABLE) ×1
HEMOSTAT SNOW SURGICEL 2X4 (HEMOSTASIS) ×2 IMPLANT
INST SET LAPROSCOPIC AP (KITS) ×2 IMPLANT
IV NS IRRIG 3000ML ARTHROMATIC (IV SOLUTION) IMPLANT
KIT ROOM TURNOVER APOR (KITS) ×2 IMPLANT
MANIFOLD NEPTUNE II (INSTRUMENTS) ×2 IMPLANT
NEEDLE INSUFFLATION 14GA 120MM (NEEDLE) ×2 IMPLANT
NS IRRIG 1000ML POUR BTL (IV SOLUTION) ×2 IMPLANT
PACK LAP CHOLE LZT030E (CUSTOM PROCEDURE TRAY) ×2 IMPLANT
PAD ARMBOARD 7.5X6 YLW CONV (MISCELLANEOUS) ×2 IMPLANT
POUCH SPECIMEN RETRIEVAL 10MM (ENDOMECHANICALS) ×2 IMPLANT
SET BASIN LINEN APH (SET/KITS/TRAYS/PACK) ×2 IMPLANT
SET TUBE IRRIG SUCTION NO TIP (IRRIGATION / IRRIGATOR) IMPLANT
SLEEVE ENDOPATH XCEL 5M (ENDOMECHANICALS) ×2 IMPLANT
SPONGE GAUZE 2X2 8PLY STRL LF (GAUZE/BANDAGES/DRESSINGS) ×8 IMPLANT
STAPLER VISISTAT (STAPLE) ×2 IMPLANT
SUT VICRYL 0 UR6 27IN ABS (SUTURE) ×2 IMPLANT
TAPE CLOTH SURG 4X10 WHT LF (GAUZE/BANDAGES/DRESSINGS) ×2 IMPLANT
TROCAR ENDO BLADELESS 11MM (ENDOMECHANICALS) ×2 IMPLANT
TROCAR XCEL NON-BLD 5MMX100MML (ENDOMECHANICALS) ×2 IMPLANT
TROCAR XCEL UNIV SLVE 11M 100M (ENDOMECHANICALS) ×2 IMPLANT
TUBE CONNECTING 12X1/4 (SUCTIONS) ×2 IMPLANT
TUBING INSUFFLATION (TUBING) ×2 IMPLANT
WARMER LAPAROSCOPE (MISCELLANEOUS) ×2 IMPLANT

## 2016-05-26 NOTE — Addendum Note (Signed)
Addendum  created 05/26/16 1052 by Shary DecampVedwattie Jissell Trafton, CRNA   Anesthesia Intra Flowsheets edited, Anesthesia Intra Meds edited

## 2016-05-26 NOTE — Anesthesia Procedure Notes (Addendum)
Procedure Name: Intubation Date/Time: 05/26/2016 8:08 AM Performed by: Patrcia DollyMOSES, Dylana Shaw Pre-anesthesia Checklist: Patient identified, Patient being monitored, Timeout performed, Emergency Drugs available and Suction available Patient Re-evaluated:Patient Re-evaluated prior to inductionOxygen Delivery Method: Circle system utilized Preoxygenation: Pre-oxygenation with 100% oxygen Intubation Type: IV induction Ventilation: Mask ventilation without difficulty Laryngoscope Size: Miller and 2 Grade View: Grade III Tube type: Oral Tube size: 7.0 mm Number of attempts: 1 Airway Equipment and Method: Stylet Placement Confirmation: ETT inserted through vocal cords under direct vision,  positive ETCO2 and breath sounds checked- equal and bilateral Secured at: 21 cm Tube secured with: Tape Dental Injury: Teeth and Oropharynx as per pre-operative assessment

## 2016-05-26 NOTE — Interval H&P Note (Signed)
History and Physical Interval Note:  05/26/2016 7:10 AM  Evelyn BertholdBarbara A Kniskern  has presented today for surgery, with the diagnosis of cholelithiasis  The various methods of treatment have been discussed with the patient and family. After consideration of risks, benefits and other options for treatment, the patient has consented to  Procedure(s): LAPAROSCOPIC CHOLECYSTECTOMY (N/A) as a surgical intervention .  The patient's history has been reviewed, patient examined, no change in status, stable for surgery.  I have reviewed the patient's chart and labs.  Questions were answered to the patient's satisfaction.     Franky MachoJENKINS,Ediberto Sens A

## 2016-05-26 NOTE — Addendum Note (Signed)
Addendum  created 05/26/16 [id number] by Shary DecampVedwattie Niala Stcharles, CRNA   Anesthesia Intra Meds edited

## 2016-05-26 NOTE — Discharge Instructions (Signed)
Laparoscopic Cholecystectomy, Care After °Refer to this sheet in the next few weeks. These instructions provide you with information about caring for yourself after your procedure. Your health care provider may also give you more specific instructions. Your treatment has been planned according to current medical practices, but problems sometimes occur. Call your health care provider if you have any problems or questions after your procedure. °WHAT TO EXPECT AFTER THE PROCEDURE °After your procedure, it is common to have: °· Pain at your incision sites. You will be given pain medicines to control your pain. °· Mild nausea or vomiting. This should improve after the first 24 hours. °· Bloating and possible shoulder pain from the gas that was used during the procedure. This will improve after the first 24 hours. °HOME CARE INSTRUCTIONS °Incision Care °· Follow instructions from your health care provider about how to take care of your incisions. Make sure you: °¨ Wash your hands with soap and water before you change your bandage (dressing). If soap and water are not available, use hand sanitizer. °¨ Change your dressing as told by your health care provider. °¨ Leave stitches (sutures), skin glue, or adhesive strips in place. These skin closures may need to be in place for 2 weeks or longer. If adhesive strip edges start to loosen and curl up, you may trim the loose edges. Do not remove adhesive strips completely unless your health care provider tells you to do that. °· Do not take baths, swim, or use a hot tub until your health care provider approves. Ask your health care provider if you can take showers. You may only be allowed to take sponge baths for bathing. °General Instructions °· Take over-the-counter and prescription medicines only as told by your health care provider. °· Do not drive or operate heavy machinery while taking prescription pain medicine. °· Return to your normal diet as told by your health care  provider. °· Do not lift anything that is heavier than 10 lb (4.5 kg). °· Do not play contact sports for one week or until your health care provider approves. °SEEK MEDICAL CARE IF:  °· You have redness, swelling, or pain at the site of your incision. °· You have fluid, blood, or pus coming from your incision. °· You notice a bad smell coming from your incision area. °· Your surgical incisions break open. °· You have a fever. °SEEK IMMEDIATE MEDICAL CARE IF: °· You develop a rash. °· You have difficulty breathing. °· You have chest pain. °· You have increasing pain in your shoulders (shoulder strap areas). °· You faint or have dizzy episodes while you are standing. °· You have severe pain in your abdomen. °· You have nausea or vomiting that lasts for more than one day. °  °This information is not intended to replace advice given to you by your health care provider. Make sure you discuss any questions you have with your health care provider. °  °Document Released: 09/29/2005 Document Revised: 06/20/2015 Document Reviewed: 05/11/2013 °Elsevier Interactive Patient Education ©2016 Elsevier Inc. ° °

## 2016-05-26 NOTE — Anesthesia Postprocedure Evaluation (Signed)
Anesthesia Post Note  Patient: Evelyn BertholdBarbara A Weigold  Procedure(s) Performed: Procedure(s) (LRB): LAPAROSCOPIC CHOLECYSTECTOMY (N/A)  Patient location during evaluation: PACU Anesthesia Type: General Level of consciousness: awake, awake and alert, patient cooperative and responds to stimulation Pain management: pain level controlled Vital Signs Assessment: post-procedure vital signs reviewed and stable Respiratory status: spontaneous breathing, respiratory function stable and non-rebreather facemask Cardiovascular status: stable Anesthetic complications: no    Last Vitals:  Vitals:   05/26/16 0735 05/26/16 0903  BP: 131/81 (!) 150/94  Pulse:  85  Resp: (!) 21 (!) 8  Temp:  36.8 C    Last Pain:  Vitals:   05/26/16 0641  TempSrc: Oral                 Chinmay Squier

## 2016-05-26 NOTE — Transfer of Care (Signed)
Immediate Anesthesia Transfer of Care Note  Patient: Evelyn BertholdBarbara A Williams  Procedure(s) Performed: Procedure(s): LAPAROSCOPIC CHOLECYSTECTOMY (N/A)  Patient Location: PACU  Anesthesia Type:General  Level of Consciousness: awake and alert   Airway & Oxygen Therapy: Patient Spontanous Breathing and Patient connected to face mask oxygen  Post-op Assessment: Report given to RN, Post -op Vital signs reviewed and stable and Patient moving all extremities X 4  Post vital signs: Reviewed and stable  Last Vitals:  Vitals:   05/26/16 0730 05/26/16 0735  BP: 131/81 131/81  Pulse:    Resp: 17 (!) 21  Temp:      Last Pain:  Vitals:   05/26/16 0641  TempSrc: Oral      Patients Stated Pain Goal: 8 (05/26/16 0641)  Complications: No apparent anesthesia complications

## 2016-05-26 NOTE — Op Note (Signed)
Patient:  Filbert BertholdBarbara A Reicher  DOB:  15-Sep-1956  MRN:  [id number]   Preop Diagnosis:  Biliary colic, cholelithiasis  Postop Diagnosis:  Same  Procedure:  Laparoscopic cholecystectomy  Surgeon:  Franky MachoMark Talise Sligh, M.D.  Assistant: Satira MccallumJason Davis, M.D.  Anes:  Gen. endotracheal  Indications:  Patient is a 60 year old black female who presents with biliary colic secondary to cholelithiasis. The risks and benefits of the procedure including bleeding, infection, hepatobiliary injury, and the possibility of an open procedure were fully explained to the patient, who gave informed consent.  Procedure note:  The patient was placed the supine position. After induction of general endotracheal anesthesia, the abdomen was prepped and draped using the usual sterile technique with DuraPrep. Surgical site confirmation was performed.  A supraumbilical incision was made down to the fascia. A Veress needle was introduced into the abdominal cavity and confirmation of placement was done using the saline drop test. The abdomen was then insufflated to 16 mmHg pressure. An 11 mm trocar was introduced into the abdominal cavity under direct visualization without difficulty. The patient was placed in reverse Trendelenburg position and an additional 11 mm trocar was placed the epigastric region 5 mm trochars were placed the right upper quadrant and right flank regions. The liver was inspected and noted to be within normal limits. The gallbladder was retracted in a dynamic fashion in order to provide a critical view of the triangle of Calot. The cystic duct was first identified. Its junction to the infundibulum was fully identified. Endoclips were placed proximally and distally on the cystic duct, and the cystic duct was divided. This was likewise done cystic artery. The gallbladder was freed away from the gallbladder fossa using Bovie electrocautery. The gallbladder was delivered through the epigastric trocar site using an Endo Catch  bag. The gallbladder fossa was inspected and no abnormal bleeding or bile leakage was noted. Surgicel was placed the gallbladder fossa. All fluid and air were then evacuated from the abdominal cavity prior to the removal of the trochars.  All wounds were irrigated with normal saline. All wounds were injected with 0.5% Sensorcaine. The supraumbilical fascia as well as epigastric fascia were reapproximated using 0 Vicryl interrupted sutures. All skin incisions were closed using staples. Betadine ointment and dry sterile dressings were applied.  All tape and needle counts were correct at the end of the procedure. Patient was extubated in the operating room and transferred to PACU in stable condition.  Complications:  None  EBL:  Minimal  Specimen:  Gallbladder

## 2016-05-26 NOTE — Anesthesia Procedure Notes (Signed)
Performed by: Hakeem Frazzini       

## 2016-05-26 NOTE — Anesthesia Preprocedure Evaluation (Signed)
Anesthesia Evaluation  Patient identified by MRN, date of birth, ID band Patient awake    Reviewed: Allergy & Precautions, NPO status , Patient's Chart, lab work & pertinent test results  Airway Mallampati: II  TM Distance: >3 FB Neck ROM: Full    Dental  (+) Teeth Intact   Pulmonary neg pulmonary ROS,    breath sounds clear to auscultation       Cardiovascular hypertension,  Rhythm:Regular Rate:Normal     Neuro/Psych    GI/Hepatic GERD  ,  Endo/Other    Renal/GU      Musculoskeletal   Abdominal   Peds  Hematology  (+) anemia ,   Anesthesia Other Findings   Reproductive/Obstetrics                             Anesthesia Physical Anesthesia Plan  ASA: II  Anesthesia Plan: General   Post-op Pain Management:    Induction: Intravenous  Airway Management Planned: Oral ETT  Additional Equipment:   Intra-op Plan:   Post-operative Plan: Extubation in OR  Informed Consent: I have reviewed the patients History and Physical, chart, labs and discussed the procedure including the risks, benefits and alternatives for the proposed anesthesia with the patient or authorized representative who has indicated his/her understanding and acceptance.     Plan Discussed with:   Anesthesia Plan Comments:         Anesthesia Quick Evaluation

## 2016-05-29 ENCOUNTER — Encounter (HOSPITAL_COMMUNITY): Payer: Self-pay | Admitting: General Surgery

## 2016-07-03 ENCOUNTER — Other Ambulatory Visit: Payer: Self-pay | Admitting: Family Medicine

## 2016-07-03 DIAGNOSIS — Z1231 Encounter for screening mammogram for malignant neoplasm of breast: Secondary | ICD-10-CM

## 2016-07-07 ENCOUNTER — Ambulatory Visit
Admission: RE | Admit: 2016-07-07 | Discharge: 2016-07-07 | Disposition: A | Discharge status: Home / Self Care | Payer: BLUE CROSS/BLUE SHIELD | Source: Ambulatory Visit | Attending: Family Medicine | Admitting: Family Medicine

## 2016-07-07 DIAGNOSIS — Z1231 Encounter for screening mammogram for malignant neoplasm of breast: Secondary | ICD-10-CM

## 2016-07-11 ENCOUNTER — Ambulatory Visit: Payer: BLUE CROSS/BLUE SHIELD

## 2017-02-10 ENCOUNTER — Encounter: Payer: Self-pay | Admitting: Gastroenterology

## 2017-07-24 ENCOUNTER — Other Ambulatory Visit: Payer: Self-pay | Admitting: Internal Medicine

## 2017-07-28 ENCOUNTER — Other Ambulatory Visit: Payer: Self-pay | Admitting: Internal Medicine

## 2017-07-28 DIAGNOSIS — E041 Nontoxic single thyroid nodule: Secondary | ICD-10-CM

## 2017-07-30 ENCOUNTER — Ambulatory Visit
Admission: RE | Admit: 2017-07-30 | Discharge: 2017-07-30 | Disposition: A | Discharge status: Home / Self Care | Payer: BLUE CROSS/BLUE SHIELD | Source: Ambulatory Visit | Attending: Internal Medicine | Admitting: Internal Medicine

## 2017-07-30 DIAGNOSIS — E041 Nontoxic single thyroid nodule: Secondary | ICD-10-CM

## 2017-08-05 ENCOUNTER — Other Ambulatory Visit: Payer: Self-pay | Admitting: Internal Medicine

## 2017-08-05 DIAGNOSIS — E042 Nontoxic multinodular goiter: Secondary | ICD-10-CM

## 2017-08-13 ENCOUNTER — Ambulatory Visit
Admission: RE | Admit: 2017-08-13 | Discharge: 2017-08-13 | Disposition: A | Discharge status: Home / Self Care | Payer: BLUE CROSS/BLUE SHIELD | Source: Ambulatory Visit | Attending: Internal Medicine | Admitting: Internal Medicine

## 2017-08-13 ENCOUNTER — Other Ambulatory Visit (HOSPITAL_COMMUNITY)
Admission: RE | Admit: 2017-08-13 | Discharge: 2017-08-13 | Disposition: A | Discharge status: Home / Self Care | Payer: BLUE CROSS/BLUE SHIELD | Source: Ambulatory Visit | Attending: Radiology | Admitting: Radiology

## 2017-08-13 DIAGNOSIS — E042 Nontoxic multinodular goiter: Secondary | ICD-10-CM | POA: Insufficient documentation

## 2018-02-03 ENCOUNTER — Other Ambulatory Visit: Payer: Self-pay | Admitting: Internal Medicine

## 2018-02-03 DIAGNOSIS — E041 Nontoxic single thyroid nodule: Secondary | ICD-10-CM

## 2018-04-27 ENCOUNTER — Other Ambulatory Visit: Payer: Self-pay | Admitting: Family Medicine

## 2018-04-27 DIAGNOSIS — N644 Mastodynia: Secondary | ICD-10-CM

## 2018-04-30 ENCOUNTER — Other Ambulatory Visit: Payer: Self-pay | Admitting: Family Medicine

## 2018-04-30 ENCOUNTER — Ambulatory Visit
Admission: RE | Admit: 2018-04-30 | Discharge: 2018-04-30 | Disposition: A | Discharge status: Home / Self Care | Payer: BLUE CROSS/BLUE SHIELD | Source: Ambulatory Visit | Attending: Family Medicine | Admitting: Family Medicine

## 2018-04-30 DIAGNOSIS — N644 Mastodynia: Secondary | ICD-10-CM

## 2018-04-30 DIAGNOSIS — R0781 Pleurodynia: Secondary | ICD-10-CM

## 2018-04-30 DIAGNOSIS — R0789 Other chest pain: Secondary | ICD-10-CM

## 2018-05-12 ENCOUNTER — Other Ambulatory Visit: Payer: Self-pay | Admitting: Family Medicine

## 2018-05-12 DIAGNOSIS — N644 Mastodynia: Secondary | ICD-10-CM

## 2018-05-19 ENCOUNTER — Other Ambulatory Visit: Payer: Self-pay | Admitting: Family Medicine

## 2018-05-19 DIAGNOSIS — R079 Chest pain, unspecified: Secondary | ICD-10-CM

## 2018-05-20 ENCOUNTER — Other Ambulatory Visit: Payer: BLUE CROSS/BLUE SHIELD

## 2018-06-10 ENCOUNTER — Other Ambulatory Visit: Payer: Self-pay | Admitting: Orthopedic Surgery

## 2018-06-10 DIAGNOSIS — R0781 Pleurodynia: Secondary | ICD-10-CM

## 2018-06-18 ENCOUNTER — Ambulatory Visit
Admission: RE | Admit: 2018-06-18 | Discharge: 2018-06-18 | Disposition: A | Discharge status: Home / Self Care | Payer: BLUE CROSS/BLUE SHIELD | Source: Ambulatory Visit | Attending: Orthopedic Surgery | Admitting: Orthopedic Surgery

## 2018-06-18 DIAGNOSIS — R0781 Pleurodynia: Secondary | ICD-10-CM

## 2018-08-05 ENCOUNTER — Other Ambulatory Visit: Payer: BLUE CROSS/BLUE SHIELD

## 2018-09-30 ENCOUNTER — Ambulatory Visit
Admission: RE | Admit: 2018-09-30 | Discharge: 2018-09-30 | Disposition: A | Discharge status: Home / Self Care | Payer: BLUE CROSS/BLUE SHIELD | Source: Ambulatory Visit | Attending: Internal Medicine | Admitting: Internal Medicine

## 2018-09-30 DIAGNOSIS — E041 Nontoxic single thyroid nodule: Secondary | ICD-10-CM

## 2019-11-20 ENCOUNTER — Other Ambulatory Visit: Payer: Self-pay

## 2019-11-20 ENCOUNTER — Ambulatory Visit: Payer: Self-pay | Attending: Internal Medicine

## 2019-11-20 DIAGNOSIS — Z23 Encounter for immunization: Secondary | ICD-10-CM | POA: Insufficient documentation

## 2019-11-20 NOTE — Progress Notes (Signed)
   Covid-19 Vaccination Clinic  Name:  LESLEE SUIRE    MRN: [id number] DOB: [birth year]  11/20/2019  Ms. Huebert was observed post Covid-19 immunization for 30 minutes based on pre-vaccination screening without incidence. She was provided with Vaccine Information Sheet and instruction to access the V-Safe system.   Ms. Vejar was instructed to call 911 with any severe reactions post vaccine: Marland Kitchen Difficulty breathing  . Swelling of your face and throat  . A fast heartbeat  . A bad rash all over your body  . Dizziness and weakness

## 2019-12-21 ENCOUNTER — Ambulatory Visit: Payer: Self-pay | Attending: Internal Medicine

## 2019-12-21 DIAGNOSIS — Z23 Encounter for immunization: Secondary | ICD-10-CM

## 2019-12-21 NOTE — Progress Notes (Signed)
   Covid-19 Vaccination Clinic  Name:  Evelyn Williams    MRN: [id number] DOB: 01-11-1956  12/21/2019  Evelyn Williams was observed post Covid-19 immunization for 30 minutes based on pre-vaccination screening without incident. She was provided with Vaccine Information Sheet and instruction to access the V-Safe system.   Evelyn Williams was instructed to call 911 with any severe reactions post vaccine: Marland Kitchen Difficulty breathing  . Swelling of face and throat  . A fast heartbeat  . A bad rash all over body  . Dizziness and weakness   Immunizations Administered    Name Date Dose VIS Date Route   Moderna COVID-19 Vaccine 12/21/2019  2:31 PM 0.5 mL 09/13/2019 Intramuscular   Manufacturer: Moderna   Lot: [id number]   NDC: 79980-012-39

## 2020-02-14 DIAGNOSIS — R69 Illness, unspecified: Secondary | ICD-10-CM | POA: Diagnosis not present

## 2020-04-20 DIAGNOSIS — M79672 Pain in left foot: Secondary | ICD-10-CM | POA: Diagnosis not present

## 2020-08-21 ENCOUNTER — Other Ambulatory Visit: Payer: Self-pay | Admitting: Internal Medicine

## 2020-08-21 ENCOUNTER — Other Ambulatory Visit: Payer: Self-pay

## 2020-08-21 ENCOUNTER — Encounter (INDEPENDENT_AMBULATORY_CARE_PROVIDER_SITE_OTHER): Payer: Self-pay | Admitting: Nurse Practitioner

## 2020-08-21 ENCOUNTER — Telehealth (INDEPENDENT_AMBULATORY_CARE_PROVIDER_SITE_OTHER): Payer: Self-pay

## 2020-08-21 ENCOUNTER — Ambulatory Visit (INDEPENDENT_AMBULATORY_CARE_PROVIDER_SITE_OTHER): Payer: Medicare HMO | Admitting: Nurse Practitioner

## 2020-08-21 ENCOUNTER — Telehealth (INDEPENDENT_AMBULATORY_CARE_PROVIDER_SITE_OTHER): Payer: Self-pay | Admitting: Nurse Practitioner

## 2020-08-21 VITALS — BP 162/102 | HR 112 | Temp 97.3°F | Ht 65.0 in | Wt 271.2 lb

## 2020-08-21 DIAGNOSIS — E785 Hyperlipidemia, unspecified: Secondary | ICD-10-CM

## 2020-08-21 DIAGNOSIS — Z13220 Encounter for screening for lipoid disorders: Secondary | ICD-10-CM

## 2020-08-21 DIAGNOSIS — I10 Essential (primary) hypertension: Secondary | ICD-10-CM | POA: Diagnosis not present

## 2020-08-21 DIAGNOSIS — R2243 Localized swelling, mass and lump, lower limb, bilateral: Secondary | ICD-10-CM

## 2020-08-21 DIAGNOSIS — R0602 Shortness of breath: Secondary | ICD-10-CM | POA: Diagnosis not present

## 2020-08-21 DIAGNOSIS — Z1231 Encounter for screening mammogram for malignant neoplasm of breast: Secondary | ICD-10-CM

## 2020-08-21 DIAGNOSIS — Z6841 Body Mass Index (BMI) 40.0 and over, adult: Secondary | ICD-10-CM

## 2020-08-21 DIAGNOSIS — E559 Vitamin D deficiency, unspecified: Secondary | ICD-10-CM | POA: Diagnosis not present

## 2020-08-21 DIAGNOSIS — Z8639 Personal history of other endocrine, nutritional and metabolic disease: Secondary | ICD-10-CM | POA: Diagnosis not present

## 2020-08-21 LAB — CBC WITH DIFFERENTIAL/PLATELET: Eosinophils Relative: 2.1 %

## 2020-08-21 NOTE — Telephone Encounter (Signed)
Order for ultrasound of thyroid and cardiac echocardiogram placed today.  Please make sure these are run through insurance and subsequently scheduled.  Thank you.

## 2020-08-21 NOTE — Telephone Encounter (Signed)
This encounter was created in error - please disregard.

## 2020-08-21 NOTE — Progress Notes (Signed)
Subjective:  Patient ID: Evelyn Williams, female    DOB: 29-Sep-1956  Age: 64 y.o. MRN: [id number]  CC:  Chief Complaint  Patient presents with  . Establish Care    Discuss BP and cholesterol       HPI  This patient arrives today to establish care. She has a few concerns that she would like to discuss as listed below:  Hypertension: She tells me she has been on Cardizem in the past but did not tolerate this medication very well to treat her blood pressure.  She is not on anything currently.  She tells me she does check her blood pressure at home periodically and it will usually be in the 409B systolically and in the 35H diastolically.  She denies any headaches or significant chest pain, but as stated below does experience shortness of breath with activity.  Hyperlipidemia: She also tells me she has been told that her cholesterol has been borderline elevated.  She is not currently on any medication for this, but would like to have her cholesterol checked today.  Obesity/Shortness of breath/ankle swelling: She has also gained quite a bit of weight over the last 2 years and is concerned about this.  She tells me its been mostly abdominal weight gain she is also noticed increased shortness of breath, ankle swelling, and need to sleep in an inclined position.  This is secondary to some sort of inflammatory disease of her ribs.  Based on her description and wondering if she was once diagnosed with costochondritis, however she still does sleep with an incline.  She tells me that the ankle swelling can be painful at times and this is very concerning to her, and makes it so that she is unable to participate in physical activity as much as she would like to.  She is hoping that she can have this treated so that she can be more physically active and try to lose weight.   Of note, upon further questioning she tells me that she does have a history of vitamin D deficiency.  She also mentions that they  had found a thyroid nodule in the past and she has undergone ultrasound as well as biopsy.  She was told to have ultrasound repeated in approximately 1 year, and tells me that was done about 2 years ago.  Thus she is overdue for ultrasound of thyroid.  Past Medical History:  Diagnosis Date  . Abdominal pain, chronic, epigastric 07/05/2013  . Anemia   . GERD (gastroesophageal reflux disease)   . H/O urinary retention   . HTN (hypertension)   . Hypertension       Family History  Problem Relation Age of Onset  . Colon cancer Neg Hx   . Colon polyps Neg Hx     Social History   Social History Narrative  . Not on file   Social History   Tobacco Use  . Smoking status: Never Smoker  . Smokeless tobacco: Never Used  Substance Use Topics  . Alcohol use: No     Current Meds  Medication Sig  . [DISCONTINUED] Multiple Vitamin (MULTIVITAMIN) tablet Take 1 tablet by mouth daily.      ROS:  See HPI   Objective:   Today's Vitals: BP (!) 162/102   Pulse (!) 112   Temp (!) 97.3 F (36.3 C) (Temporal)   Ht $R'5\' 5"'tr$  (1.651 m)   Wt 271 lb 3.2 oz (123 kg)   SpO2 98%  BMI 45.13 kg/m  Vitals with BMI 08/21/2020 05/26/2016 05/26/2016  Height $Remov'5\' 5"'ejamhU$  - -  Weight 271 lbs 3 oz - -  BMI 82.99 - -  Systolic 371 696 789  Diastolic 381 77 88  Pulse 112 85 94     Physical Exam Vitals reviewed.  Constitutional:      General: She is not in acute distress.    Appearance: Normal appearance. She is obese.  HENT:     Head: Normocephalic and atraumatic.  Neck:     Vascular: No carotid bruit.  Cardiovascular:     Rate and Rhythm: Normal rate and regular rhythm.     Pulses: Normal pulses.     Heart sounds: Normal heart sounds.  Pulmonary:     Effort: Pulmonary effort is normal.     Breath sounds: Normal breath sounds.  Skin:    General: Skin is warm and dry.  Neurological:     General: No focal deficit present.     Mental Status: She is alert and oriented to person, place, and time.    Psychiatric:        Mood and Affect: Mood normal.        Behavior: Behavior normal.        Judgment: Judgment normal.          Assessment and Plan   1. Hypertension, unspecified type   2. History of vitamin D deficiency   3. Class 3 severe obesity with serious comorbidity and body mass index (BMI) of 45.0 to 49.9 in adult, unspecified obesity type ([place])   4. Screening, lipid   5. Hyperlipidemia, unspecified hyperlipidemia type   6. History of thyroid nodule   7. Localized swelling, mass, or lump of lower extremity, bilateral   8. Shortness of breath      Plan: 1.  She probably does need to restart an antihypertensive agent.  She tells me she has tried losartan in the past and has not tolerated it well.  She would like to consider starting back on Cardizem.  We will for start by getting blood work to check for kidney function as I do not see that this is been completed within our electronic medical record system within the last few years.  As stated below I am concerned with possible heart failure so would hold off on Cardizem, but may consider starting patient on beta-blocker if kidney function results come back normal.  She will follow-up in about 1 week to have blood pressure checked. 2.  We will check vitamin D level today as part of blood work. 3., 7.--8.  We will work with her regarding her weight, in the meantime because of her other symptoms that make me concerned for heart failure we will also send for cardiac echocardiogram for further evaluation. 4.-5.  We will check lipid panel for further evaluation. 6.  We will send for ultrasound of thyroid for further evaluation of history of thyroid nodule.  We will also request previous medical records so that we can compare the 2 ultrasounds.   Tests ordered Orders Placed This Encounter  Procedures  . US THYROID  . CBC with Differential/Platelets  . CMP with eGFR([facility])  . Lipid Panel  . Hemoglobin A1c  . TSH  . Vitamin  D, 25-hydroxy  . ECHOCARDIOGRAM COMPLETE      No orders of the defined types were placed in this encounter.   Patient to follow-up in 1 week or sooner as needed.  [place],  NP

## 2020-08-21 NOTE — Telephone Encounter (Signed)
Molli Knock will start work up and & get PA's done ASAP.

## 2020-08-21 NOTE — Telephone Encounter (Signed)
[facility] is need referral for a Diagnostic order to do her breast screening.

## 2020-08-21 NOTE — Telephone Encounter (Signed)
She needs diagnostic mammogram not a screening mammogram ordered?

## 2020-08-22 ENCOUNTER — Other Ambulatory Visit (INDEPENDENT_AMBULATORY_CARE_PROVIDER_SITE_OTHER): Payer: Self-pay | Admitting: Nurse Practitioner

## 2020-08-22 ENCOUNTER — Encounter (INDEPENDENT_AMBULATORY_CARE_PROVIDER_SITE_OTHER): Payer: Self-pay | Admitting: Nurse Practitioner

## 2020-08-22 DIAGNOSIS — Z1231 Encounter for screening mammogram for malignant neoplasm of breast: Secondary | ICD-10-CM

## 2020-08-22 DIAGNOSIS — I10 Essential (primary) hypertension: Secondary | ICD-10-CM

## 2020-08-22 DIAGNOSIS — N644 Mastodynia: Secondary | ICD-10-CM

## 2020-08-22 LAB — COMPLETE METABOLIC PANEL WITHOUT GFR
AG Ratio: 1.7 (calc) (ref 1.0–2.5)
ALT: 10 U/L (ref 6–29)
AST: 16 U/L (ref 10–35)
Albumin: 4.3 g/dL (ref 3.6–5.1)
Alkaline phosphatase (APISO): 85 U/L (ref 37–153)
BUN: 15 mg/dL (ref 7–25)
CO2: 29 mmol/L (ref 20–32)
Calcium: 9.7 mg/dL (ref 8.6–10.4)
Chloride: 108 mmol/L (ref 98–110)
Creat: 0.8 mg/dL (ref 0.50–0.99)
GFR, Est African American: 90 mL/min/{1.73_m2}
GFR, Est Non African American: 77 mL/min/{1.73_m2}
Globulin: 2.6 g/dL (ref 1.9–3.7)
Glucose, Bld: 111 mg/dL — ABNORMAL HIGH (ref 65–99)
Potassium: 5.3 mmol/L (ref 3.5–5.3)
Sodium: 142 mmol/L (ref 135–146)
Total Bilirubin: 0.3 mg/dL (ref 0.2–1.2)
Total Protein: 6.9 g/dL (ref 6.1–8.1)

## 2020-08-22 LAB — LIPID PANEL
Cholesterol: 199 mg/dL (ref <200)
HDL: 56 mg/dL (ref >=50)
LDL Cholesterol (Calc): 125 mg/dL (calc) — ABNORMAL HIGH
Non-HDL Cholesterol (Calc): 143 mg/dL (calc) — ABNORMAL HIGH (ref <130)
Total CHOL/HDL Ratio: 3.6 (calc) (ref <5.0)
Triglycerides: 85 mg/dL (ref <150)

## 2020-08-22 LAB — TSH: TSH: 1.56 m[IU]/L (ref 0.40–4.50)

## 2020-08-22 LAB — CBC WITH DIFFERENTIAL/PLATELET
Absolute Monocytes: 419 cells/uL (ref 200–950)
Basophils Absolute: 28 cells/uL (ref 0–200)
Basophils Relative: 0.4 %
Eosinophils Absolute: 149 cells/uL (ref 15–500)
HCT: 42 % (ref 35.0–45.0)
Hemoglobin: 12.8 g/dL (ref 11.7–15.5)
Lymphs Abs: 1413 cells/uL (ref 850–3900)
MCH: 24 pg — ABNORMAL LOW (ref 27.0–33.0)
MCHC: 30.5 g/dL — ABNORMAL LOW (ref 32.0–36.0)
MCV: 78.7 fL — ABNORMAL LOW (ref 80.0–100.0)
MPV: 10.6 fL (ref 7.5–12.5)
Monocytes Relative: 5.9 %
Neutro Abs: 5091 cells/uL (ref 1500–7800)
Neutrophils Relative %: 71.7 %
Platelets: 326 10*3/uL (ref 140–400)
RBC: 5.34 10*6/uL — ABNORMAL HIGH (ref 3.80–5.10)
RDW: 12.6 % (ref 11.0–15.0)
Total Lymphocyte: 19.9 %
WBC: 7.1 10*3/uL (ref 3.8–10.8)

## 2020-08-22 LAB — HEMOGLOBIN A1C
Hgb A1c MFr Bld: 5.1 %{Hb}
Mean Plasma Glucose: 100 (calc)
eAG (mmol/L): 5.5 (calc)

## 2020-08-22 LAB — VITAMIN D 25 HYDROXY (VIT D DEFICIENCY, FRACTURES): Vit D, 25-Hydroxy: 22 ng/mL — ABNORMAL LOW (ref 30–100)

## 2020-08-22 MED ORDER — METOPROLOL TARTRATE 25 MG PO TABS: 25.0000 mg | ORAL_TABLET | Freq: Two times a day (BID) | ORAL | 2 refills | Status: DC

## 2020-08-22 NOTE — Telephone Encounter (Signed)
Correct;due to pt tell scheduler  that she left pain in a location.

## 2020-08-22 NOTE — Telephone Encounter (Signed)
I have ordered diagnostic mammogram instead of screening mammogram. Thank you.

## 2020-08-22 NOTE — Telephone Encounter (Signed)
Routed note to [facility].

## 2020-08-28 ENCOUNTER — Other Ambulatory Visit: Payer: Self-pay

## 2020-08-28 ENCOUNTER — Ambulatory Visit (INDEPENDENT_AMBULATORY_CARE_PROVIDER_SITE_OTHER): Payer: Medicare HMO | Admitting: Internal Medicine

## 2020-08-28 ENCOUNTER — Encounter (INDEPENDENT_AMBULATORY_CARE_PROVIDER_SITE_OTHER): Payer: Self-pay | Admitting: Internal Medicine

## 2020-08-28 VITALS — BP 148/84 | HR 105 | Temp 97.5°F | Ht 65.0 in | Wt 270.8 lb

## 2020-08-28 DIAGNOSIS — Z8639 Personal history of other endocrine, nutritional and metabolic disease: Secondary | ICD-10-CM

## 2020-08-28 DIAGNOSIS — Z6841 Body Mass Index (BMI) 40.0 and over, adult: Secondary | ICD-10-CM

## 2020-08-28 DIAGNOSIS — H9311 Tinnitus, right ear: Secondary | ICD-10-CM | POA: Diagnosis not present

## 2020-08-28 DIAGNOSIS — I10 Essential (primary) hypertension: Secondary | ICD-10-CM

## 2020-08-28 NOTE — Progress Notes (Signed)
Metrics: Intervention Frequency ACO  Documented Smoking Status Yearly  Screened one or more times in 24 months  Cessation Counseling or  Active cessation medication Past 24 months  Past 24 months   Guideline developer: UpToDate (See UpToDate for funding source) Date Released: 2014       Wellness Office Visit  Subjective:  Patient ID: Evelyn Williams, female    DOB: 1956-08-14  Age: 64 y.o. MRN: [id number]  CC: This lady comes in for follow-up of hypertension, obesity and vitamin D deficiency. HPI  She had seen Maralyn Sago for the first time just a week ago.  Vitamin D levels were very low and she has been taking vitamin D3 5000 units daily. She also has been taking metoprolol.  She notices some nausea.  However she is adjusting to this reasonably okay. She did describe right-sided tinnitus which she had had prior to starting metoprolol. Past Medical History:  Diagnosis Date  . Abdominal pain, chronic, epigastric 07/05/2013  . Anemia   . GERD (gastroesophageal reflux disease)   . H/O urinary retention   . HTN (hypertension)   . Hypertension    Past Surgical History:  Procedure Laterality Date  . ABDOMINAL HYSTERECTOMY  1990   Menometrorragia.Endometriosis  . APPENDECTOMY    . CHOLECYSTECTOMY N/A 05/26/2016   Procedure: LAPAROSCOPIC CHOLECYSTECTOMY;  Surgeon: Franky Macho, MD;  Location: [facility];  Service: General;  Laterality: N/A;  . COLONOSCOPY  03/04/2011   ULA:GTXMI internal hemorrhoids/Redundant transverse colon  . ESOPHAGOGASTRODUODENOSCOPY N/A 07/11/2013   Procedure: ESOPHAGOGASTRODUODENOSCOPY (EGD);  Surgeon: West Bali, MD;  Location: [facility];  Service: Endoscopy;  Laterality: N/A;  1:45  . KNEE SURGERY     both  . TONSILLECTOMY       Family History  Problem Relation Age of Onset  . Lung cancer Mother   . Stroke Father   . Colon cancer Neg Hx   . Colon polyps Neg Hx     Social History   Social History Narrative   Married for 29 years.Retired,previously  [employer], broadcasting/film/video.   Social History   Tobacco Use  . Smoking status: Never Smoker  . Smokeless tobacco: Never Used  Substance Use Topics  . Alcohol use: No    Current Meds  Medication Sig  . metoprolol tartrate (LOPRESSOR) 25 MG tablet Take 1 tablet (25 mg total) by mouth 2 (two) times daily.      No flowsheet data found.   Objective:   Today's Vitals: BP (!) 148/84   Pulse (!) 105   Temp (!) 97.5 F (36.4 C) (Temporal)   Ht 5\' 5"  (1.651 m)   Wt 270 lb 12.8 oz (122.8 kg)   SpO2 97%   BMI 45.06 kg/m  Vitals with BMI 08/28/2020 08/21/2020 05/26/2016  Height 5\' 5"  5\' 5"  -  Weight 270 lbs 13 oz 271 lbs 3 oz -  BMI 45.06 45.13 -  Systolic 148 162 05/28/2016  Diastolic 84 102 77  Pulse 105 85     Physical Exam  She remains morbidly obese.  Blood pressure has improved compared to the last time.  She is alert and orientated without any obvious focal neurological signs.     Assessment   1. History of vitamin D deficiency   2. Class 3 severe obesity with serious comorbidity and body mass index (BMI) of 45.0 to 49.9 in adult, unspecified obesity type ([facility])   3. Hypertension, unspecified type   4. Tinnitus of right ear  Tests ordered Orders Placed This Encounter  Procedures  . Ambulatory referral to ENT     Plan: 1. She will continue with metoprolol as before for the time being. 2. She will continue with vitamin D3 5000 units daily. 3. She clearly needs to lose weight and today we spent some time discussing nutrition.  I introduced the idea of intermittent fasting, ensuring hydration and a plant-based diet. 4. I will refer to ENT for her right-sided tinnitus. 5. Follow-up with Maralyn Sago in about 3 months.   No orders of the defined types were placed in this encounter.   Wilson Singer, MD

## 2020-08-28 NOTE — Patient Instructions (Signed)
[facility] Dietary Recommendations for Weight Loss What to Avoid . Avoid added sugars o Often added sugar can be found in processed foods such as many condiments, dry cereals, cakes, cookies, chips, crisps, crackers, candies, sweetened drinks, etc.  o Read labels and AVOID/DECREASE use of foods with the following in their ingredient list: Sugar, fructose, high fructose corn syrup, sucrose, glucose, maltose, dextrose, molasses, cane sugar, Greff sugar, any type of syrup, agave nectar, etc.   . Avoid snacking in between meals . Avoid foods made with flour o If you are going to eat food made with flour, choose those made with whole-grains; and, minimize your consumption as much as is tolerable . Avoid processed foods o These foods are generally stocked in the middle of the grocery store. Focus on shopping on the perimeter of the grocery.  . Avoid Meat  o We recommend following a plant-based diet at [facility]. Thus, we recommend avoiding meat as a general rule. Consider eating beans, legumes, eggs, and/or dairy products for regular protein sources o If you plan on eating meat limit to 4 ounces of meat at a time and choose lean options such as Fish, chicken, turkey. Avoid red meat intake such as pork and/or steak What to Include . Vegetables o GREEN LEAFY VEGETABLES: Kale, spinach, mustard greens, collard greens, cabbage, broccoli, etc. o OTHER: Asparagus, cauliflower, eggplant, carrots, peas, Brussel sprouts, tomatoes, bell peppers, zucchini, beets, cucumbers, etc. . Grains, seeds, and legumes o Beans: kidney beans, black eyed peas, garbanzo beans, black beans, pinto beans, etc. o Whole, unrefined grains: [place] rice, barley, bulgur, oatmeal, etc. . Healthy fats  o Avoid highly processed fats such as vegetable oil o Examples of healthy fats: avocado, olives, virgin olive oil, dark chocolate (?72% Cocoa), nuts (peanuts, almonds, walnuts, cashews, pecans, etc.) . None to Low  Intake of Animal Sources of Protein o Meat sources: chicken, turkey, salmon, tuna. Limit to 4 ounces of meat at one time. o Consider limiting dairy sources, but when choosing dairy focus on: PLAIN Greek yogurt, cottage cheese, high-protein milk . Fruit o Choose berries  When to Eat . Intermittent Fasting: o Choosing not to eat for a specific time period, but DO FOCUS ON HYDRATION when fasting o Multiple Techniques: - Time Restricted Eating: eat 3 meals in a day, each meal lasting no more than 60 minutes, no snacks between meals - 16-18 hour fast: fast for 16 to 18 hours up to 7 days a week. Often suggested to start with 2-3 nonconsecutive days per week.  . Remember the time you sleep is counted as fasting.  . Examples of eating schedule: Fast from 7:00pm-11:00am. Eat between 11:00am-7:00pm.  - 24-hour fast: fast for 24 hours up to every other day. Often suggested to start with 1 day per week . Remember the time you sleep is counted as fasting . Examples of eating schedule:  o Eating day: eat 2-3 meals on your eating day. If doing 2 meals, each meal should last no more than 90 minutes. If doing 3 meals, each meal should last no more than 60 minutes. Finish last meal by 7:00pm. o Fasting day: Fast until 7:00pm.  o IF YOU FEEL UNWELL FOR ANY REASON/IN ANY WAY WHEN FASTING, STOP FASTING BY EATING A NUTRITIOUS SNACK OR LIGHT MEAL o ALWAYS FOCUS ON HYDRATION DURING FASTS - Acceptable Hydration sources: water, broths, tea/coffee (black tea/coffee is best but using a small amount of whole-fat dairy products in coffee/tea is acceptable).  -   Poor Hydration Sources: anything with sugar or artificial sweeteners added to it  These recommendations have been developed for patients that are actively receiving medical care from either Dr. Christino Mcglinchey or Sarah Gray, DNP, NP-C at [facility]. These recommendations are developed for patients with specific medical conditions and are not meant to be  distributed or used by others that are not actively receiving care from either provider listed above at [facility]. It is not appropriate to participate in the above eating plans without proper medical supervision.   Reference: Fung, J. The obesity code. [place]: [place]; 2016.   

## 2020-08-29 ENCOUNTER — Other Ambulatory Visit: Payer: Self-pay | Admitting: Internal Medicine

## 2020-08-29 DIAGNOSIS — Z1231 Encounter for screening mammogram for malignant neoplasm of breast: Secondary | ICD-10-CM

## 2020-08-31 ENCOUNTER — Ambulatory Visit (HOSPITAL_COMMUNITY)
Admission: RE | Admit: 2020-08-31 | Discharge: 2020-08-31 | Disposition: A | Discharge status: Home / Self Care | Payer: Medicare HMO | Source: Ambulatory Visit | Attending: Nurse Practitioner | Admitting: Nurse Practitioner

## 2020-08-31 ENCOUNTER — Other Ambulatory Visit: Payer: Self-pay

## 2020-08-31 DIAGNOSIS — R2243 Localized swelling, mass and lump, lower limb, bilateral: Secondary | ICD-10-CM | POA: Diagnosis not present

## 2020-08-31 DIAGNOSIS — R0602 Shortness of breath: Secondary | ICD-10-CM

## 2020-08-31 LAB — ECHOCARDIOGRAM COMPLETE
AR max vel: 2.36 cm2
AV Area VTI: 2.47 cm2
AV Area mean vel: 2.36 cm2
AV Mean grad: 3.9 mmHg
AV Peak grad: 7.9 mmHg
Ao pk vel: 1.41 m/s
Area-P 1/2: 2.71 cm2
S' Lateral: 2.9 cm

## 2020-08-31 NOTE — Progress Notes (Signed)
*  PRELIMINARY RESULTS* Echocardiogram 2D Echocardiogram has been performed.  Evelyn Williams 08/31/2020, 10:25 AM

## 2020-09-03 ENCOUNTER — Other Ambulatory Visit: Payer: Self-pay

## 2020-09-03 ENCOUNTER — Ambulatory Visit (HOSPITAL_COMMUNITY)
Admission: RE | Admit: 2020-09-03 | Discharge: 2020-09-03 | Disposition: A | Discharge status: Home / Self Care | Payer: Medicare HMO | Source: Ambulatory Visit | Attending: Nurse Practitioner | Admitting: Nurse Practitioner

## 2020-09-03 DIAGNOSIS — E042 Nontoxic multinodular goiter: Secondary | ICD-10-CM | POA: Diagnosis not present

## 2020-09-03 DIAGNOSIS — Z8639 Personal history of other endocrine, nutritional and metabolic disease: Secondary | ICD-10-CM | POA: Diagnosis not present

## 2020-09-27 ENCOUNTER — Other Ambulatory Visit: Payer: Self-pay

## 2020-09-27 ENCOUNTER — Ambulatory Visit (HOSPITAL_COMMUNITY)
Admission: RE | Admit: 2020-09-27 | Discharge: 2020-09-27 | Disposition: A | Discharge status: Home / Self Care | Payer: Medicare HMO | Source: Ambulatory Visit | Attending: Internal Medicine | Admitting: Internal Medicine

## 2020-09-27 DIAGNOSIS — Z1231 Encounter for screening mammogram for malignant neoplasm of breast: Secondary | ICD-10-CM | POA: Insufficient documentation

## 2020-10-01 ENCOUNTER — Telehealth (INDEPENDENT_AMBULATORY_CARE_PROVIDER_SITE_OTHER): Payer: Self-pay

## 2020-10-01 NOTE — Telephone Encounter (Signed)
From what I can see the patient had a screening mammogram completed on 09/27/20, but I had ordered a diagnostic mammogram on 08/22/20 which has not been done. The screening mammogram results have not come back from the radiologist yet. If you have time, can you call over to radiology and see if they will read the mammogram and provide insight on the results. They may be back logged from being short staffed. If she did not get a diagnostic mammogram then depending on the screening mammogram results she may need to repeat a diagnostic one, but will not be able to make that determination until we receive results of the screening mammogram.

## 2020-10-01 NOTE — Telephone Encounter (Signed)
I called radiology on this patient's behalf as her mammogram has not yet been read by radiologist.  They recommended waiting a few more days and if no report has been returned to me by the end of the week to call back.  I have called the patient and discussed this with her as well.  I have recommended she hold off on getting the Covid19 booster shot until her mammogram results have returned to determine whether or not she needs to undergo diagnostic mammogram so as not to affect results of diagnostic mammogram related to possible lymphadenopathy secondary to COVID-19 vaccine.  She tells me she understands and will await further recommendations later this week. I will call her or have medical assistant call her back on my behalf once mammogram report has been finalized.

## 2020-10-01 NOTE — Telephone Encounter (Signed)
Patient called and requested her mammogram results. Patient would like to get her 3rd Covid vaccine and waiting on results before scheduling for her booster vaccine.  Please advise.

## 2020-10-03 NOTE — Telephone Encounter (Signed)
Patient called back and Maralyn Sago discuss with patient.

## 2020-10-03 NOTE — Telephone Encounter (Addendum)
I called this patient back today to let her know that her mammogram is negative.  She did not answer the phone but I did leave her a HIPAA compliant voicemail asking her to call the office back.  If she calls back please come and get me to discuss these results with the patient over the phone. She had some other follow-up questions pending these results, so I would like to speak to her. Thank you.

## 2020-10-03 NOTE — Telephone Encounter (Signed)
This patient did call back today and we discussed her mammogram. She tells me she is not having any progression in breast pain. She has not noted any changes to consistency of density in her breast when performing self exams. She has not noted any new nipple discharge or nipple inversion. I have recommended she proceed with Covid 19 booster and I do not feel repeat mammogram is warranted at this time.  I did tell her that if she does experience any changes in consistency to the density of her breast, notices nipple discharge, or nipple inversion that she should notify us immediately.  She tells me she understands.  She also questions regarding her cardiac echocardiogram and I did discuss these results with her today as well.

## 2020-10-18 ENCOUNTER — Ambulatory Visit: Payer: PRIVATE HEALTH INSURANCE | Attending: Internal Medicine

## 2020-10-18 DIAGNOSIS — Z23 Encounter for immunization: Secondary | ICD-10-CM

## 2020-10-18 NOTE — Progress Notes (Signed)
   Covid-19 Vaccination Clinic  Name:  Evelyn Williams    MRN: [id number] DOB: Jul 03, 1956  10/18/2020  [name] was observed post Covid-19 immunization for 30 minutes based on pre-vaccination screening without incident. She was provided with Vaccine Information Sheet and instruction to access the V-Safe system.   [name] was instructed to call 911 with any severe reactions post vaccine: Marland Kitchen Difficulty breathing  . Swelling of face and throat  . A fast heartbeat  . A bad rash all over body  . Dizziness and weakness   Immunizations Administered    Name Date Dose VIS Date Route   Moderna Covid-19 Booster Vaccine 10/18/2020  2:34 PM 0.25 mL 08/01/2020 Intramuscular   Manufacturer: Gala Murdoch   Lot: [id number]   NDC: 02637-858-85

## 2020-11-13 ENCOUNTER — Other Ambulatory Visit (INDEPENDENT_AMBULATORY_CARE_PROVIDER_SITE_OTHER): Payer: Self-pay | Admitting: Nurse Practitioner

## 2020-11-13 DIAGNOSIS — I10 Essential (primary) hypertension: Secondary | ICD-10-CM

## 2020-11-28 ENCOUNTER — Encounter (INDEPENDENT_AMBULATORY_CARE_PROVIDER_SITE_OTHER): Payer: Self-pay | Admitting: Nurse Practitioner

## 2020-11-28 ENCOUNTER — Other Ambulatory Visit: Payer: Self-pay

## 2020-11-28 ENCOUNTER — Other Ambulatory Visit (INDEPENDENT_AMBULATORY_CARE_PROVIDER_SITE_OTHER): Payer: Self-pay | Admitting: Nurse Practitioner

## 2020-11-28 ENCOUNTER — Ambulatory Visit (INDEPENDENT_AMBULATORY_CARE_PROVIDER_SITE_OTHER): Payer: Medicare HMO | Admitting: Nurse Practitioner

## 2020-11-28 VITALS — BP 160/100 | HR 97 | Temp 96.6°F | Ht 65.5 in | Wt 248.0 lb

## 2020-11-28 DIAGNOSIS — M25512 Pain in left shoulder: Secondary | ICD-10-CM

## 2020-11-28 DIAGNOSIS — Z6841 Body Mass Index (BMI) 40.0 and over, adult: Secondary | ICD-10-CM | POA: Diagnosis not present

## 2020-11-28 DIAGNOSIS — I10 Essential (primary) hypertension: Secondary | ICD-10-CM | POA: Diagnosis not present

## 2020-11-28 DIAGNOSIS — Z8639 Personal history of other endocrine, nutritional and metabolic disease: Secondary | ICD-10-CM | POA: Diagnosis not present

## 2020-11-28 DIAGNOSIS — E559 Vitamin D deficiency, unspecified: Secondary | ICD-10-CM | POA: Diagnosis not present

## 2020-11-28 DIAGNOSIS — Z87892 Personal history of anaphylaxis: Secondary | ICD-10-CM | POA: Diagnosis not present

## 2020-11-28 MED ORDER — AMLODIPINE BESYLATE 2.5 MG PO TABS: 2.5000 mg | ORAL_TABLET | Freq: Every day | ORAL | 0 refills | Status: DC

## 2020-11-28 MED ORDER — EPINEPHRINE 0.3 MG/0.3ML IJ SOAJ: 0.3000 mg | INTRAMUSCULAR | 2 refills | Status: AC | PRN

## 2020-11-28 MED ORDER — DILTIAZEM HCL ER 120 MG PO CP24: 120.0000 mg | ORAL_CAPSULE | Freq: Every day | ORAL | 0 refills | Status: DC

## 2020-11-28 NOTE — Progress Notes (Signed)
Subjective:  Patient ID: Evelyn Williams, female    DOB: [birth year]  Age: 65 y.o. MRN: [id number]  CC:  Chief Complaint  Patient presents with  . Hypertension  . Other    Vitamin D deficiency, right shoulder pain  . Obesity      HPI  This patient arrives today for the above.  Hypertension: We did start her on metoprolol last office visit she tells me she did not tolerate this medication well and felt that she was experiencing some kidney pain.  She has since weaned herself off of it is not taking it.  I was concerned about restarting diltiazem due to possibility of heart failure because she had describes some lower extremity swelling as well as shortness of breath with activity.  Since that visit she has been exercising more and has been eating a bit better and has lost about 20 pounds.  She tells me that the swelling in the shortness of breath have essentially resolved.  Vitamin D deficiency: She has started on vitamin D3 5000 IUs by mouth daily.  Last serum check was collected in November 2021 and it was 22.  She is due to have serum recheck today.  Left shoulder pain: She tells me she has been experiencing some pain in her left shoulder when she lays on it as well as with moving it.  She tells me this has started since she got her COVID-19 booster approximately 1 month ago.  Obesity: She is been try to lose weight and has lost approximately 22 pounds.    Past Medical History:  Diagnosis Date  . Abdominal pain, chronic, epigastric 07/05/2013  . Anemia   . GERD (gastroesophageal reflux disease)   . H/O urinary retention   . HTN (hypertension)   . Hypertension       Family History  Problem Relation Age of Onset  . Lung cancer Mother   . Stroke Father   . Colon cancer Neg Hx   . Colon polyps Neg Hx     Social History   Social History Narrative   Married for 29 years.Retired,previously Pharmacist, hospital.   Social History   Tobacco Use  . Smoking status: Never Smoker   . Smokeless tobacco: Never Used  Substance Use Topics  . Alcohol use: No     Current Meds  Medication Sig  . amLODipine (NORVASC) 2.5 MG tablet Take 1 tablet (2.5 mg total) by mouth daily.  . Cholecalciferol 1.25 MG (50000 UT) TABS Take 5,000 Int'l Units by mouth.  . EPINEPHrine 0.3 mg/0.3 mL IJ SOAJ injection Inject 0.3 mg into the muscle as needed for anaphylaxis.    ROS:  See HPI   Objective:   Today's Vitals: BP (!) 160/100 (BP Location: Right Arm, Patient Position: Sitting)   Pulse 97   Temp (!) 96.6 F (35.9 C) (Temporal)   Ht 5' 5.5" (1.664 m)   Wt 248 lb (112.5 kg)   SpO2 97%   BMI 40.64 kg/m  Vitals with BMI 11/28/2020 08/28/2020 08/21/2020  Height 5' 5.5" $Remov'5\' 5"'nTUsEB$  $Remove'5\' 5"'EsOIbHz$   Weight 248 lbs 270 lbs 13 oz 271 lbs 3 oz  BMI 40.63 43.32 95.18  Systolic 841 660 630  Diastolic 160 84 109  Pulse 97 105 112     Physical Exam Vitals reviewed.  Constitutional:      General: She is not in acute distress.    Appearance: Normal appearance.  HENT:     Head: Normocephalic and atraumatic.  Neck:     Vascular: No carotid bruit.  Cardiovascular:     Rate and Rhythm: Normal rate and regular rhythm.     Pulses: Normal pulses.     Heart sounds: Normal heart sounds.  Pulmonary:     Effort: Pulmonary effort is normal.     Breath sounds: Normal breath sounds.  Musculoskeletal:     Left shoulder: Tenderness present. No swelling. Decreased range of motion.     Comments: ROM decrease due to pain with forward flexion and abduction  Skin:    General: Skin is warm and dry.  Neurological:     General: No focal deficit present.     Mental Status: She is alert and oriented to person, place, and time.  Psychiatric:        Mood and Affect: Mood normal.        Behavior: Behavior normal.        Judgment: Judgment normal.          Assessment and Plan   1. Hypertension, unspecified type   2. History of anaphylaxis   3. Vitamin D deficiency   4. Acute pain of left shoulder    5. Class 3 severe obesity with serious comorbidity and body mass index (BMI) of 40.0 to 44.9 in adult, unspecified obesity type ([place])      Plan: 1., 2.  We will check CMP for further evaluation of kidney function, patient does not feel she can urinate today's we will hold off on getting a urine sample.  We did discuss that untreated hypertension does increase risk of kidney disease, heart attack, stroke, and blindness later on her life.  She tells me she understands.  I recommended that she does not tolerate the metoprolol that we consider a different agent.  We will try amlodipine 2.5 mg daily, I did get an alert in the system that amlodipine has pinkeye and the patient has pink food coloring as an allergy.  I did ask her about this and she tells me her only allergy is to shellfish and iodine dye, but she is able to eat foods with pink or red food coloring without difficulty.  She does not have an EpiPen available to her.  Thus, I did discuss this with my supervising physician Dr. Anastasio Champion and as far as he knows there is no contraindication to pink dye or red dye in an individual who has shellfish or iodine dye allergy.  Did discuss this with the patient and she is agreeable to take the amlodipine and to let me know if she has any significant negative outcomes.  I did prescribe EpiPen that she can take as needed and that if she does experience signs of anaphylaxis (swelling tongue, swollen throat, difficulty breathing, etc.) that she needs to take EpiPen and call 911.  She tells me she understands.  She will follow-up in about 2 weeks to check her blood pressure. 3.  We will check vitamin D level today for further evaluation. 4.  I think she may have bursitis.  We discussed trialing a course of prednisone which tells me she does not tolerate this medication well.  Instead she is elected to take ibuprofen 400 mg by mouth every 8 hours as needed as well as Tylenol every 8 hours as needed.  If in 2 weeks her  symptoms persist may consider further evaluation. 5.  She is congratulated on her weight loss encouraged to continue making the lifestyle changes she is already implemented.  Tests ordered Orders Placed This Encounter  Procedures  . Vitamin D, 25-hydroxy  . CMP with eGFR([facility])      Meds ordered this encounter  Medications  . amLODipine (NORVASC) 2.5 MG tablet    Sig: Take 1 tablet (2.5 mg total) by mouth daily.    Dispense:  90 tablet    Refill:  0    Per patient her allergy is to shellfish and iodine dye NOT to food coloring red or pink    Order Specific Question:   Supervising Provider    Answer:   Hurshel Party C [[id number]]  . EPINEPHrine 0.3 mg/0.3 mL IJ SOAJ injection    Sig: Inject 0.3 mg into the muscle as needed for anaphylaxis.    Dispense:  1 each    Refill:  2    Order Specific Question:   Supervising Provider    Answer:   Doree Albee [[id number]]    Patient to follow-up in 2 weeks or sooner as needed.  Ailene Ards, NP

## 2020-11-28 NOTE — Patient Instructions (Signed)
Ibuprofen 400mg  by mouth every 8 hours as needed for shoulder pain; and tylenol 500-1000mg  by mouth every 8 hours as needed for shoulder pain.   Amlodipine Tablets What is this medicine? AMLODIPINE (am LOE di peen) is a calcium channel blocker. It relaxes your blood vessels and decreases the amount of work the heart has to do. It treats high blood pressure and/or prevents chest pain (also called angina). This medicine may be used for other purposes; ask your health care provider or pharmacist if you have questions. COMMON BRAND NAME(S): Norvasc What should I tell my health care provider before I take this medicine? They need to know if you have any of these conditions:  heart disease  liver disease  an unusual or allergic reaction to amlodipine, other drugs, foods, dyes, or preservatives  pregnant or trying to get pregnant  breast-feeding How should I use this medicine? Take this medicine by mouth. Take it as directed on the prescription label at the same time every day. You can take it with or without food. If it upsets your stomach, take it with food. Keep taking it unless your health care provider tells you to stop. Talk to your health care provider about the use of this medicine in children. While it may be prescribed for children as young as 6 for selected conditions, precautions do apply. Overdosage: If you think you have taken too much of this medicine contact a poison control center or emergency room at once. NOTE: This medicine is only for you. Do not share this medicine with others. What if I miss a dose? If you miss a dose, take it as soon as you can. If it is almost time for your next dose, take only that dose. Do not take double or extra doses. What may interact with this medicine? This medicine may interact with the following medications:  clarithromycin  cyclosporine  diltiazem  itraconazole  simvastatin  tacrolimus This list may not describe all possible  interactions. Give your health care provider a list of all the medicines, herbs, non-prescription drugs, or dietary supplements you use. Also tell them if you smoke, drink alcohol, or use illegal drugs. Some items may interact with your medicine. What should I watch for while using this medicine? Visit your health care provider for regular checks on your progress. Check your blood pressure as directed. Ask your health care provider what your blood pressure should be. Also, find out when you should contact him or her. Do not treat yourself for coughs, colds, or pain while you are using this medicine without asking your health care provider for advice. Some medicines may increase your blood pressure. You may get drowsy or dizzy. Do not drive, use machinery, or do anything that needs mental alertness until you know how this medicine affects you. Do not stand up or sit up quickly, especially if you are an older patient. This reduces the risk of dizzy or fainting spells. Alcohol can make you more drowsy and dizzy. Avoid alcoholic drinks. What side effects may I notice from receiving this medicine? Side effects that you should report to your doctor or health care provider as soon as possible:  allergic reactions (skin rash, itching or hives; swelling of the face, lips, or tongue)  heart attack (trouble breathing; pain or tightness in the chest, neck, back or arms; unusually weak or tired)  low blood pressure (dizziness; feeling faint or lightheaded, falls; unusually weak or tired) Side effects that usually do not require medical  attention (report these to your doctor or health care provider if they continue or are bothersome):  facial flushing  nausea  palpitations  stomach pain  sudden weight gain  swelling of the ankles, feet, hands This list may not describe all possible side effects. Call your doctor for medical advice about side effects. You may report side effects to FDA at  1-800-FDA-1088. Where should I keep my medicine? Keep out of the reach of children and pets. Store at room temperature between 20 and 25 degrees C (68 and 77 degrees F). Protect from light and moisture. Keep the container tightly closed. Get rid of any unused medicine after the expiration date. To get rid of medicines that are no longer needed or have expired:  Take the medicine to a medicine take-back program. Check with your pharmacy or law enforcement to find a location.  If you cannot return the medicine, check the label or package insert to see if the medicine should be thrown out in the garbage or flushed down the toilet. If you are not sure, ask your health care provider. If it is safe to put in the trash, empty the medicine out of the container. Mix the medicine with cat litter, dirt, coffee grounds, or other unwanted substance. Seal the mixture in a bag or container. Put it in the trash. NOTE: This sheet is a summary. It may not cover all possible information. If you have questions about this medicine, talk to your doctor, pharmacist, or health care provider.  2021 Elsevier/Gold Standard (2020-08-25 14:59:47)

## 2020-11-28 NOTE — Progress Notes (Signed)
Patient called back stating that she was uncomfortable with trying the amlodipine due to reading about certain side effects.  She would like to try her diltiazem that she is taken in the past.  Per chart review I see that she is been on 240 mg by mouth daily in the past, however I am and start her on a smaller dose of 120 mg by mouth daily and may need to titrate up her blood pressure readings.  She was notified of this and the fact that allergy to pink dye also was flagged again with diltiazem that she should discuss this with the pharmacist prior to starting the medication.  She tells me she understands and she will still follow-up as scheduled in a couple of weeks.

## 2020-11-29 ENCOUNTER — Encounter (INDEPENDENT_AMBULATORY_CARE_PROVIDER_SITE_OTHER): Payer: Self-pay | Admitting: Nurse Practitioner

## 2020-11-29 LAB — COMPLETE METABOLIC PANEL WITH GFR
AG Ratio: 1.8 (calc) (ref 1.0–2.5)
ALT: 12 U/L (ref 6–29)
AST: 16 U/L (ref 10–35)
Albumin: 4.2 g/dL (ref 3.6–5.1)
Alkaline phosphatase (APISO): 92 U/L (ref 37–153)
BUN: 8 mg/dL (ref 7–25)
CO2: 26 mmol/L (ref 20–32)
Calcium: 9.3 mg/dL (ref 8.6–10.4)
Chloride: 107 mmol/L (ref 98–110)
Creat: 0.72 mg/dL (ref 0.50–0.99)
GFR, Est African American: 102 mL/min/{1.73_m2} (ref >=60)
GFR, Est Non African American: 88 mL/min/{1.73_m2} (ref >=60)
Globulin: 2.4 g/dL (calc) (ref 1.9–3.7)
Glucose, Bld: 101 mg/dL — ABNORMAL HIGH (ref 65–99)
Potassium: 4.3 mmol/L (ref 3.5–5.3)
Sodium: 142 mmol/L (ref 135–146)
Total Bilirubin: 0.5 mg/dL (ref 0.2–1.2)
Total Protein: 6.6 g/dL (ref 6.1–8.1)

## 2020-11-29 LAB — VITAMIN D 25 HYDROXY (VIT D DEFICIENCY, FRACTURES): Vit D, 25-Hydroxy: 52 ng/mL (ref 30–100)

## 2020-12-13 ENCOUNTER — Ambulatory Visit (INDEPENDENT_AMBULATORY_CARE_PROVIDER_SITE_OTHER): Payer: Medicare HMO | Admitting: Nurse Practitioner

## 2021-01-01 ENCOUNTER — Encounter (INDEPENDENT_AMBULATORY_CARE_PROVIDER_SITE_OTHER): Payer: Self-pay | Admitting: Nurse Practitioner

## 2021-01-01 ENCOUNTER — Ambulatory Visit (INDEPENDENT_AMBULATORY_CARE_PROVIDER_SITE_OTHER): Payer: Medicare HMO | Admitting: Nurse Practitioner

## 2021-01-01 ENCOUNTER — Other Ambulatory Visit: Payer: Self-pay

## 2021-01-01 ENCOUNTER — Telehealth (INDEPENDENT_AMBULATORY_CARE_PROVIDER_SITE_OTHER): Payer: Self-pay | Admitting: Nurse Practitioner

## 2021-01-01 VITALS — BP 132/71 | HR 97 | Temp 97.1°F | Resp 18 | Ht 65.0 in | Wt 246.0 lb

## 2021-01-01 DIAGNOSIS — R1310 Dysphagia, unspecified: Secondary | ICD-10-CM

## 2021-01-01 DIAGNOSIS — Z1211 Encounter for screening for malignant neoplasm of colon: Secondary | ICD-10-CM | POA: Diagnosis not present

## 2021-01-01 DIAGNOSIS — I10 Essential (primary) hypertension: Secondary | ICD-10-CM | POA: Diagnosis not present

## 2021-01-01 NOTE — Progress Notes (Signed)
Subjective:  Patient ID: Evelyn Williams, female    DOB: [birth year]  Age: 65 y.o. MRN: [id number]  CC:  Chief Complaint  Patient presents with  . Hypertension      HPI  This patient arrives today for the above.  We restarted her diltiazem at last office visit.  He is currently on 120 mg daily.  She tolerated medication well.  She does report that she experiences a "whooshing" sensation in her ears sometimes in the evening and wonders if her blood pressure is becoming elevated during that time.  She does have an at home blood pressure cuff, but has not monitored or checked her blood pressure when she experienced that whooshing sensation.  Otherwise, she reports that sometimes the diltiazem feels like it gets stuck in her throat and she cannot swallow it easily.  She does not experience this with other food or medications.  She does feel a sensation of being full for longer than she thinks is appropriate.  For example she tells me she will have dinner around 6 and by bedtime around 10 PM will still feel quite full.  She is wondering if taking an antacid would help this sensation.  Per chart review I do not see any evidence of hiatal hernia when she had chest CT scan done in 2019.  She does have a history of gastritis but has not been to a gastroenterologist recently.  She tells me she also believes she is due for her colonoscopy for colon cancer screening.  Per chart review I do not see that she has had one in the [facility] within the last 10 years.  Past Medical History:  Diagnosis Date  . Abdominal pain, chronic, epigastric 07/05/2013  . Anemia   . GERD (gastroesophageal reflux disease)   . H/O urinary retention   . HTN (hypertension)   . Hypertension       Family History  Problem Relation Age of Onset  . Lung cancer Mother   . Stroke Father   . Colon cancer Neg Hx   . Colon polyps Neg Hx     Social History   Social History Narrative   Married for 29  years.Retired,previously Pharmacist, hospital.   Social History   Tobacco Use  . Smoking status: Never Smoker  . Smokeless tobacco: Never Used  Substance Use Topics  . Alcohol use: No     Current Meds  Medication Sig  . Cholecalciferol 1.25 MG (50000 UT) TABS Take 5,000 Int'l Units by mouth.  . diltiazem (DILACOR XR) 120 MG 24 hr capsule Take 1 capsule (120 mg total) by mouth daily.  Marland Kitchen EPINEPHrine 0.3 mg/0.3 mL IJ SOAJ injection Inject 0.3 mg into the muscle as needed for anaphylaxis.    ROS:  Review of Systems  Eyes: Negative for blurred vision.  Respiratory: Negative for shortness of breath.   Cardiovascular: Positive for chest pain (chest wall pain).  Neurological: Negative for headaches.     Objective:   Today's Vitals: BP 132/71 (BP Location: Left Arm, Patient Position: Sitting, Cuff Size: Normal)   Pulse 97   Temp (!) 97.1 F (36.2 C) (Temporal)   Resp 18   Ht $R'5\' 5"'YZ$  (1.651 m)   Wt 246 lb (111.6 kg)   SpO2 98%   BMI 40.94 kg/m  Vitals with BMI 01/01/2021 11/28/2020 08/28/2020  Height $Remov'5\' 5"'vKyVDS$  5' 5.5" $Remov'5\' 5"'KYJChD$   Weight 246 lbs 248 lbs 270 lbs 13 oz  BMI 40.94 40.63 45.06  Systolic 193 790 240  Diastolic 71 973 84  Pulse 97 97 105     Physical Exam Vitals reviewed.  Constitutional:      General: She is not in acute distress.    Appearance: Normal appearance.  HENT:     Head: Normocephalic and atraumatic.  Neck:     Vascular: No carotid bruit.  Cardiovascular:     Rate and Rhythm: Normal rate and regular rhythm.     Pulses: Normal pulses.     Heart sounds: Normal heart sounds.  Pulmonary:     Effort: Pulmonary effort is normal.     Breath sounds: Normal breath sounds.  Skin:    General: Skin is warm and dry.  Neurological:     General: No focal deficit present.     Mental Status: She is alert and oriented to person, place, and time.  Psychiatric:        Mood and Affect: Mood normal.        Behavior: Behavior normal.        Judgment: Judgment normal.           Assessment and Plan   1. Hypertension, unspecified type   2. Colon cancer screening   3. Dysphagia, unspecified type      Plan: 1.  Blood pressure is much improved on current dose of diltiazem.  I recommended that she check her blood pressure when she experiences that whooshing sensation when she talks but.  If it is elevated and I defined this as greater than 140 over greater than 95 she should call this office and we will consider increasing her dose of diltiazem.  She tells me she understands. 2.,  3.  We will refer her to gastroenterology for colon cancer screening.  I did encourage her to consider trialing use of over-the-counter antacids she preferred to try Tums and/or Maalox first to see if that helps with the fullness sensation.  If it does then very well could be related to acid reflux.  If she does not feel any improvement with the antacid, my recommendation would be that she undergo evaluation by gastroenterology for the dysphagia and sensation of her fullness.  As far as the dysphagia with the diltiazem I recommended she try to take the diltiazem with a small spoonful of applesauce to see if this would help her with the swelling.  She is agreeable to trying the over-the-counter antiacid in applesauce first, and may consider seeing gastroenterology for her dysphagia and sensation of fullness if symptoms persist.   Tests ordered Orders Placed This Encounter  Procedures  . CMP with eGFR([facility])  . Ambulatory referral to Gastroenterology      No orders of the defined types were placed in this encounter.   Patient to follow-up in 3 months or sooner as needed.  Ailene Ards, NP

## 2021-01-01 NOTE — Telephone Encounter (Signed)
Referring to GI for colon cancer screening. She would prefer to see Dr. Karilyn Cota if he is still accepting new patients.

## 2021-01-01 NOTE — Telephone Encounter (Signed)
Ok I will go in and change this information to go to him.

## 2021-01-02 ENCOUNTER — Encounter: Payer: Self-pay | Admitting: Internal Medicine

## 2021-01-02 LAB — COMPLETE METABOLIC PANEL WITH GFR
AG Ratio: 1.3 (calc) (ref 1.0–2.5)
ALT: 15 U/L (ref 6–29)
AST: 16 U/L (ref 10–35)
Albumin: 3.9 g/dL (ref 3.6–5.1)
Alkaline phosphatase (APISO): 95 U/L (ref 37–153)
BUN: 10 mg/dL (ref 7–25)
CO2: 27 mmol/L (ref 20–32)
Calcium: 9.3 mg/dL (ref 8.6–10.4)
Chloride: 107 mmol/L (ref 98–110)
Creat: 0.82 mg/dL (ref 0.50–0.99)
GFR, Est African American: 86 mL/min/{1.73_m2} (ref >=60)
GFR, Est Non African American: 75 mL/min/{1.73_m2} (ref >=60)
Globulin: 2.9 g/dL (calc) (ref 1.9–3.7)
Glucose, Bld: 95 mg/dL (ref 65–139)
Potassium: 4.5 mmol/L (ref 3.5–5.3)
Sodium: 141 mmol/L (ref 135–146)
Total Bilirubin: 0.4 mg/dL (ref 0.2–1.2)
Total Protein: 6.8 g/dL (ref 6.1–8.1)

## 2021-01-29 ENCOUNTER — Telehealth (INDEPENDENT_AMBULATORY_CARE_PROVIDER_SITE_OTHER): Payer: Self-pay | Admitting: Nurse Practitioner

## 2021-01-29 NOTE — Telephone Encounter (Signed)
Left message for patient to call back and schedule Medicare Annual Wellness Visit (AWV) either virtually or in office.   awvi 12/11/20 per [facility]  please schedule at anytime with  health coach  This should be a 45 minute visit.

## 2021-01-30 ENCOUNTER — Telehealth (INDEPENDENT_AMBULATORY_CARE_PROVIDER_SITE_OTHER): Payer: Self-pay

## 2021-01-30 NOTE — Telephone Encounter (Signed)
Scheduled patient with Evelyn Williams for AWV on 02/26/2021. Patient requested to see Evelyn Williams.

## 2021-02-06 ENCOUNTER — Telehealth (INDEPENDENT_AMBULATORY_CARE_PROVIDER_SITE_OTHER): Payer: Self-pay

## 2021-02-06 ENCOUNTER — Telehealth: Payer: Self-pay | Admitting: Internal Medicine

## 2021-02-06 NOTE — Telephone Encounter (Signed)
I think any provider that works with our Gastroenterologists in the area are great! If she wants to see Dr. Kendell Bane specifically she can try to call their office and let the scheduler know. She may have to wait a bit longer for a visit with him. Because this is a consult for colon cancer screening, this appointment may be just to discuss the different screening options. The office may require this appointment type be completed by one of his colleagues. The actual colonoscopy (if she chooses this screening option) would be completed by a medical doctor and she can request Dr. Kendell Bane specifically to complete the procedure. I do encourage her to call the office scheduler if she would still like to see Dr. Kendell Bane for her initial visit. I believe their phone number [phone].

## 2021-02-06 NOTE — Telephone Encounter (Signed)
Pt called asking when her last colonoscopy was done. I can't find it in epic. Can you check behind me and I will call the patient back.   She also was referred by Dr Karilyn Cota to schedule her next colonoscopy and is aware of her OV on 5/3. She is wanting to see Dr Jena Gauss and not Dr Marletta Lor. I explained to her that all of Dr Darrick Penna' patients went to Dr Marletta Lor and Dr Jena Gauss and Dr Marletta Lor do not see each others patients. She got frustrated with me and said she would get back with me to see if she will keep her appointment. [phone]

## 2021-02-06 NOTE — Telephone Encounter (Signed)
Patient called and left a detailed voice message that she was scheduled with a different doctor at [facility] other than with Dr. Jena Gauss and was really concerned since she had discussed seeing Dr. Jena Gauss with Maralyn Sago. Patient has several concerns and wanted to discuss them and check with Maralyn Sago to see what she thinks of the new doctor? [phone]

## 2021-02-06 NOTE — Telephone Encounter (Signed)
03/04/11. Its an Op note under the notes tab.

## 2021-02-06 NOTE — Telephone Encounter (Signed)
Pt's last colonoscopy was in 2012 by Dr Darrick Penna and she is wanting to have you as her doctor now instead of Dr Marletta Lor. She is scheduled for a triage OV on Tuesday 02/12/2021 to schedule her 10 year colonoscopy. Are you willing to take her as your patient?

## 2021-02-07 NOTE — Telephone Encounter (Signed)
Called patient and gave her the message. Patient stated that she was just a little shocked because she was not expecting to have a different doctor there and thought it was just Dr. Jena Gauss. Patient stated that she was fine to see the new doctor and do a consult and she has heard great things about Dr. Jena Gauss and was just concerned. Patient verbalized an understanding and thanked [place].

## 2021-02-08 NOTE — Telephone Encounter (Signed)
Yes

## 2021-02-08 NOTE — Telephone Encounter (Signed)
Dr Jena Gauss agreed to take her as his patietn. I LMOM that she will be his patient now and reminder her of her OV on 5/3

## 2021-02-12 ENCOUNTER — Ambulatory Visit: Payer: Medicare HMO | Admitting: Gastroenterology

## 2021-02-26 ENCOUNTER — Ambulatory Visit (INDEPENDENT_AMBULATORY_CARE_PROVIDER_SITE_OTHER): Payer: Medicare HMO | Admitting: Nurse Practitioner

## 2021-02-28 ENCOUNTER — Telehealth (INDEPENDENT_AMBULATORY_CARE_PROVIDER_SITE_OTHER): Payer: Self-pay | Admitting: Internal Medicine

## 2021-02-28 ENCOUNTER — Other Ambulatory Visit (INDEPENDENT_AMBULATORY_CARE_PROVIDER_SITE_OTHER): Payer: Self-pay | Admitting: Nurse Practitioner

## 2021-02-28 DIAGNOSIS — I10 Essential (primary) hypertension: Secondary | ICD-10-CM

## 2021-02-28 NOTE — Telephone Encounter (Signed)
I just did that.

## 2021-03-27 ENCOUNTER — Ambulatory Visit (INDEPENDENT_AMBULATORY_CARE_PROVIDER_SITE_OTHER): Payer: Medicare HMO | Admitting: Nurse Practitioner

## 2021-03-27 ENCOUNTER — Other Ambulatory Visit: Payer: Self-pay

## 2021-03-27 ENCOUNTER — Encounter (INDEPENDENT_AMBULATORY_CARE_PROVIDER_SITE_OTHER): Payer: Self-pay | Admitting: Nurse Practitioner

## 2021-03-27 VITALS — BP 140/98 | HR 88 | Temp 97.3°F | Ht 65.0 in | Wt 239.0 lb

## 2021-03-27 DIAGNOSIS — I10 Essential (primary) hypertension: Secondary | ICD-10-CM | POA: Diagnosis not present

## 2021-03-27 DIAGNOSIS — R1310 Dysphagia, unspecified: Secondary | ICD-10-CM

## 2021-03-27 DIAGNOSIS — R12 Heartburn: Secondary | ICD-10-CM

## 2021-03-27 MED ORDER — DILTIAZEM HCL ER 180 MG PO CP24: 180.0000 mg | ORAL_CAPSULE | Freq: Every day | ORAL | 1 refills | Status: DC

## 2021-03-27 NOTE — Patient Instructions (Signed)

## 2021-03-27 NOTE — Progress Notes (Signed)
Subjective:  Patient ID: Evelyn Williams, female    DOB: 1956-02-02  Age: 65 y.o. MRN: [id number]  CC:  Chief Complaint  Patient presents with   Follow-up    Not sleeping and would like a sleep aid, Discuss BP medications as she is having a hard time swallowing them and does not think they are working well   Hypertension   Other    Dysphagia, GERD      HPI  This patient arrives today for the above.  Hypertension: She continues on diltiazem 120 mg daily.  She tells me that she feels the diltiazem started to wear off as the day progresses because she starts to experience a whooshing feeling in her ears which previously had been an indicator of elevated blood pressure for her.  Otherwise she is tolerating medication well.  Dysphagia/heartburn: She does experience heartburn intermittently.  She will take Tums or Maalox or Pepto-Bismol and this seems to help treat the symptoms.  I do see where she has had endoscopy back in 2014 which did show hiatal hernia and gastritis but no esophagitis.  She does experience difficulty with swallowing medications at times and feels she remains full longer than what she would think would be normal.  She was post to see gastroenterology for colonoscopy consult, however had to cancel that appointment due to having an unexpected death in the family.  She tells me she plans on rescheduling this.  She denies any pain with swallowing.  Past Medical History:  Diagnosis Date   Abdominal pain, chronic, epigastric 07/05/2013   Anemia    GERD (gastroesophageal reflux disease)    H/O urinary retention    HTN (hypertension)    Hypertension       Family History  Problem Relation Age of Onset   Lung cancer Mother    Stroke Father    Colon cancer Neg Hx    Colon polyps Neg Hx     Social History   Social History Narrative   Married for 29 years.Retired,previously Runner, broadcasting/film/video.   Social History   Tobacco Use   Smoking status: Never   Smokeless tobacco:  Never  Substance Use Topics   Alcohol use: No     Current Meds  Medication Sig   Cholecalciferol (VITAMIN D3) 125 MCG (5000 UT) CAPS Take 5,000 Units by mouth daily.   EPINEPHrine 0.3 mg/0.3 mL IJ SOAJ injection Inject 0.3 mg into the muscle as needed for anaphylaxis.   [DISCONTINUED] DILT-XR 120 MG 24 hr capsule TAKE 1 CAPSULE BY MOUTH EVERY DAY    ROS:  Review of Systems  Respiratory:  Negative for shortness of breath.   Cardiovascular:  Negative for chest pain.  Gastrointestinal:  Positive for heartburn.  Neurological:  Negative for headaches.    Objective:   Today's Vitals: BP (!) 140/98   Pulse 88   Temp (!) 97.3 F (36.3 C) (Temporal)   Ht 5\' 5"  (1.651 m)   Wt 239 lb (108.4 kg)   SpO2 98%   BMI 39.77 kg/m  Vitals with BMI 03/27/2021 01/01/2021 11/28/2020  Height 5\' 5"  5\' 5"  5' 5.5"  Weight 239 lbs 246 lbs 248 lbs  BMI 39.77 40.94 40.63  Systolic 140 132 11/30/2020  Diastolic 98 71 100  Pulse 88 97 97     Physical Exam Vitals reviewed.  Constitutional:      General: She is not in acute distress.    Appearance: Normal appearance.  HENT:  Head: Normocephalic and atraumatic.  Neck:     Vascular: No carotid bruit.  Cardiovascular:     Rate and Rhythm: Normal rate and regular rhythm.     Pulses: Normal pulses.     Heart sounds: Normal heart sounds.  Pulmonary:     Effort: Pulmonary effort is normal.     Breath sounds: Normal breath sounds.  Skin:    General: Skin is warm and dry.  Neurological:     General: No focal deficit present.     Mental Status: She is alert and oriented to person, place, and time.  Psychiatric:        Mood and Affect: Mood normal.        Behavior: Behavior normal.        Judgment: Judgment normal.         Assessment and Plan   1. Dysphagia, unspecified type   2. Hypertension, unspecified type   3. Heart burn      Plan: 1.,  3.  I recommend she call her gastroenterologist to schedule follow-up so she can complete colon  cancer screening consult as well as to discuss her dysphagia.  I offered to prescribe either H2 blocker or PPI, but for now she prefers to just continue taking over-the-counter medications such as Tums and Maalox. 2.  We will increase her diltiazem to 180 mg daily and she will follow-up in 1 month to closely monitor her blood pressure.   Tests ordered No orders of the defined types were placed in this encounter.     Meds ordered this encounter  Medications   diltiazem (DILT-XR) 180 MG 24 hr capsule    Sig: Take 1 capsule (180 mg total) by mouth daily.    Dispense:  30 capsule    Refill:  1    Order Specific Question:   Supervising Provider    Answer:   Wilson Singer [[id number]]    Patient to follow-up in 1 month or sooner as needed.  Elenore Paddy, NP

## 2021-03-28 ENCOUNTER — Telehealth (INDEPENDENT_AMBULATORY_CARE_PROVIDER_SITE_OTHER): Payer: Self-pay

## 2021-03-28 NOTE — Telephone Encounter (Signed)
Patient called and left a detailed voice message that she needs something for a sleep aide as she is not sleeping well and thinks this is causing weight gain.   Called patient back after discussing with Maralyn Sago and left a detailed voice message on her voice mail for her to try OTC Melatonin 1 mg and take per instructions on the package. Patient should start with the lowest dose and can go up to 5 mg if she needs to. If this does not work then patient needs to call back and at least schedule a telephone visit with Maralyn Sago if she is not able to come to the office to discuss different sleep aide options.

## 2021-04-04 ENCOUNTER — Ambulatory Visit (INDEPENDENT_AMBULATORY_CARE_PROVIDER_SITE_OTHER): Payer: Medicare HMO | Admitting: Nurse Practitioner

## 2021-04-20 ENCOUNTER — Other Ambulatory Visit (INDEPENDENT_AMBULATORY_CARE_PROVIDER_SITE_OTHER): Payer: Self-pay | Admitting: Nurse Practitioner

## 2021-04-20 DIAGNOSIS — I10 Essential (primary) hypertension: Secondary | ICD-10-CM

## 2021-04-30 ENCOUNTER — Other Ambulatory Visit: Payer: Self-pay

## 2021-04-30 ENCOUNTER — Encounter (INDEPENDENT_AMBULATORY_CARE_PROVIDER_SITE_OTHER): Payer: Self-pay | Admitting: Nurse Practitioner

## 2021-04-30 ENCOUNTER — Ambulatory Visit (INDEPENDENT_AMBULATORY_CARE_PROVIDER_SITE_OTHER): Payer: Medicare HMO | Admitting: Nurse Practitioner

## 2021-04-30 DIAGNOSIS — I10 Essential (primary) hypertension: Secondary | ICD-10-CM

## 2021-04-30 MED ORDER — DILTIAZEM HCL ER 240 MG PO CP24: 240.0000 mg | ORAL_CAPSULE | Freq: Every day | ORAL | 0 refills | Status: DC

## 2021-04-30 NOTE — Progress Notes (Signed)
Subjective:  Patient ID: Filbert Berthold, female    DOB: October 30, 1955  Age: 65 y.o. MRN: [id number]  CC:  Chief Complaint  Patient presents with   Follow-up    Doing okay, thinks she may have been dehydrated last week   Hypertension      HPI  This patient arrives today for the above.  Hypertension: At last office visit we increased her diltiazem to 180 milligrams daily.  She used to be on 240 daily previously.  She tells me since increasing the dose to 180, she is not experiencing that whooshing in her ears quite as frequently as she was before however she does hear it sometimes.  She also wants to know if she could try a supplement called Golo to help with weight loss.  Past Medical History:  Diagnosis Date   Abdominal pain, chronic, epigastric 07/05/2013   Anemia    GERD (gastroesophageal reflux disease)    H/O urinary retention    HTN (hypertension)    Hypertension       Family History  Problem Relation Age of Onset   Lung cancer Mother    Stroke Father    Colon cancer Neg Hx    Colon polyps Neg Hx     Social History   Social History Narrative   Married for 29 years.Retired,previously Runner, broadcasting/film/video.   Social History   Tobacco Use   Smoking status: Never   Smokeless tobacco: Never  Substance Use Topics   Alcohol use: No     Current Meds  Medication Sig   Cholecalciferol (VITAMIN D3) 125 MCG (5000 UT) CAPS Take 5,000 Units by mouth daily.   EPINEPHrine 0.3 mg/0.3 mL IJ SOAJ injection Inject 0.3 mg into the muscle as needed for anaphylaxis.   [DISCONTINUED] DILT-XR 180 MG 24 hr capsule TAKE 1 CAPSULE BY MOUTH EVERY DAY    ROS:  Review of Systems  HENT:         (+) wooshing sound intermittently in ears  Eyes:  Negative for blurred vision.  Cardiovascular:  Negative for chest pain.  Neurological:  Negative for dizziness and headaches.    Objective:   Today's Vitals: BP (!) 136/94   Pulse (!) 107   Temp 97.7 F (36.5 C) (Temporal)   Ht 5\' 5"   (1.651 m)   Wt 237 lb 6.4 oz (107.7 kg)   SpO2 99%   BMI 39.51 kg/m  Vitals with BMI 04/30/2021 03/27/2021 01/01/2021  Height 5\' 5"  5\' 5"  5\' 5"   Weight 237 lbs 6 oz 239 lbs 246 lbs  BMI 39.51 39.77 40.94  Systolic 136 140 01/03/2021  Diastolic 94 98 71  Pulse 107 88 97     Physical Exam Vitals reviewed.  Constitutional:      General: She is not in acute distress.    Appearance: Normal appearance.  HENT:     Head: Normocephalic and atraumatic.  Neck:     Vascular: No carotid bruit.  Cardiovascular:     Rate and Rhythm: Normal rate and regular rhythm.     Pulses: Normal pulses.     Heart sounds: Normal heart sounds.  Pulmonary:     Effort: Pulmonary effort is normal.     Breath sounds: Normal breath sounds.  Skin:    General: Skin is warm and dry.  Neurological:     General: No focal deficit present.     Mental Status: She is alert and oriented to person, place, and time.  Psychiatric:  Mood and Affect: Mood normal.        Behavior: Behavior normal.        Judgment: Judgment normal.         Assessment and Plan   1. Hypertension, unspecified type      Plan: 1.  Blood pressure still borderline elevated.  She tells me she just refilled a 90-day supply of the 180 mg dose so I recommend she take that until it runs out and then we will increase her to 240 mg dose.  She will follow-up in approximately 4 months so we can see how she is tolerating the elevated dose of the diltiazem.  As far as supplements go I recommended she make sure she gets a product that is verified by third-party and that I cannot speak to the efficacy and safety of most supplements but if she were to feel unwell for any reason while taking supplement she definitely needs to stop.  She tells me she understands.   Tests ordered No orders of the defined types were placed in this encounter.     Meds ordered this encounter  Medications   diltiazem (DILT-XR) 240 MG 24 hr capsule    Sig: Take 1  capsule (240 mg total) by mouth daily.    Dispense:  30 capsule    Refill:  0    Order Specific Question:   Supervising Provider    Answer:   Wilson Singer [[id number]]     Patient to follow-up in 4 months with Dr. Karilyn Cota or sooner as needed.  Elenore Paddy, NP

## 2021-05-27 ENCOUNTER — Other Ambulatory Visit (INDEPENDENT_AMBULATORY_CARE_PROVIDER_SITE_OTHER): Payer: Self-pay | Admitting: Nurse Practitioner

## 2021-05-27 DIAGNOSIS — I10 Essential (primary) hypertension: Secondary | ICD-10-CM

## 2021-06-09 ENCOUNTER — Other Ambulatory Visit (INDEPENDENT_AMBULATORY_CARE_PROVIDER_SITE_OTHER): Payer: Self-pay | Admitting: Nurse Practitioner

## 2021-06-09 DIAGNOSIS — I10 Essential (primary) hypertension: Secondary | ICD-10-CM

## 2021-07-03 ENCOUNTER — Other Ambulatory Visit (INDEPENDENT_AMBULATORY_CARE_PROVIDER_SITE_OTHER): Payer: Self-pay | Admitting: Nurse Practitioner

## 2021-07-03 DIAGNOSIS — I10 Essential (primary) hypertension: Secondary | ICD-10-CM

## 2021-09-12 ENCOUNTER — Other Ambulatory Visit (INDEPENDENT_AMBULATORY_CARE_PROVIDER_SITE_OTHER): Payer: Self-pay | Admitting: Nurse Practitioner

## 2021-09-12 DIAGNOSIS — I10 Essential (primary) hypertension: Secondary | ICD-10-CM

## 2021-09-18 ENCOUNTER — Ambulatory Visit (INDEPENDENT_AMBULATORY_CARE_PROVIDER_SITE_OTHER): Payer: Self-pay | Admitting: Nurse Practitioner

## 2021-12-18 DIAGNOSIS — I10 Essential (primary) hypertension: Secondary | ICD-10-CM | POA: Diagnosis not present

## 2021-12-18 DIAGNOSIS — Z131 Encounter for screening for diabetes mellitus: Secondary | ICD-10-CM | POA: Diagnosis not present

## 2021-12-18 DIAGNOSIS — Z1211 Encounter for screening for malignant neoplasm of colon: Secondary | ICD-10-CM | POA: Diagnosis not present

## 2021-12-18 DIAGNOSIS — M25511 Pain in right shoulder: Secondary | ICD-10-CM | POA: Diagnosis not present

## 2021-12-24 DIAGNOSIS — Z1231 Encounter for screening mammogram for malignant neoplasm of breast: Secondary | ICD-10-CM | POA: Diagnosis not present

## 2022-01-01 DIAGNOSIS — H524 Presbyopia: Secondary | ICD-10-CM | POA: Diagnosis not present

## 2022-01-21 DIAGNOSIS — M7541 Impingement syndrome of right shoulder: Secondary | ICD-10-CM | POA: Diagnosis not present

## 2022-01-21 DIAGNOSIS — M25511 Pain in right shoulder: Secondary | ICD-10-CM | POA: Diagnosis not present

## 2022-03-06 ENCOUNTER — Telehealth: Payer: Self-pay | Admitting: Gastroenterology

## 2022-03-06 NOTE — Telephone Encounter (Signed)
Hi Dr. Christella Hartigan,  This patient was referred to you by her PCP, Evelyn Williams.  Patient is requesting a transfer of care from [facility].  She said they told her she had hemorrhoids when, in fact, she had a hiatal hernia.  She said she is constantly having issues with constipation where it feels like it gets stuck to one side of her colon and she has to push with her hands to align it to be able to go.  She can't sleep at night and is tossing and turning and doesn't feel like her food is digesting well, and if not careful, may cause her to vomit.  She does not want to go back to [facility] due to the misinformation given to her and the feeling of not getting the proper care.  Please advise if you approve the transfer.  Thank you.

## 2022-03-07 ENCOUNTER — Encounter: Payer: Self-pay | Admitting: Gastroenterology

## 2022-04-16 ENCOUNTER — Encounter: Payer: Self-pay | Admitting: Gastroenterology

## 2022-04-16 ENCOUNTER — Ambulatory Visit: Payer: Medicare HMO | Admitting: Gastroenterology

## 2022-04-16 DIAGNOSIS — R142 Eructation: Secondary | ICD-10-CM | POA: Diagnosis not present

## 2022-04-16 DIAGNOSIS — Z1211 Encounter for screening for malignant neoplasm of colon: Secondary | ICD-10-CM | POA: Diagnosis not present

## 2022-04-16 DIAGNOSIS — R0600 Dyspnea, unspecified: Secondary | ICD-10-CM | POA: Diagnosis not present

## 2022-04-16 DIAGNOSIS — K5900 Constipation, unspecified: Secondary | ICD-10-CM

## 2022-04-16 MED ORDER — CITRUCEL PO POWD: 1.0000 | Freq: Every day | ORAL | Status: AC

## 2022-04-16 MED ORDER — NA SULFATE-K SULFATE-MG SULF 17.5-3.13-1.6 GM/177ML PO SOLN: 1.0000 | ORAL | 0 refills | Status: AC

## 2022-04-16 NOTE — Patient Instructions (Signed)
If you are age 67 or older, your body mass index should be between 23-30. Your Body mass index is 41.14 kg/m. If this is out of the aforementioned range listed, please consider follow up with your Primary Care Provider. ________________________________________________________  The [facility] providers would like to encourage you to use [facility] to communicate with providers for non-urgent requests or questions.  Due to long hold times on the telephone, sending your provider a message by [facility] may be a faster and more efficient way to get a response.  Please allow 48 business hours for a response.  Please remember that this is for non-urgent requests.  _______________________________________________________  Bonita Quin have been scheduled for an endoscopy and colonoscopy. Please follow the written instructions given to you at your visit today. Please pick up your prep supplies at the pharmacy within the next 1-3 days. If you use inhalers (even only as needed), please bring them with you on the day of your procedure.  Due to recent changes in healthcare laws, you may see the results of your imaging and laboratory studies on MyChart before your provider has had a chance to review them.  We understand that in some cases there may be results that are confusing or concerning to you. Not all laboratory results come back in the same time frame and the provider may be waiting for multiple results in order to interpret others.  Please give [place] 48 hours in order for your provider to thoroughly review all the results before contacting the office for clarification of your results.   Please start taking citrucel (orange flavored) powder fiber supplement.  This may cause some bloating at first but that usually goes away. Begin with a small spoonful and work your way up to a large, heaping spoonful daily over a week.  Thank you for entrusting me with your care and choosing [facility].  Dr Christella Hartigan

## 2022-04-16 NOTE — Progress Notes (Signed)
HPI: This is a very pleasant 66 year old woman who was referred to me by Marcelle Overlie, MD  to evaluate constipation, GERD, belching, routine risk for colon cancer.    She had a colonoscopy about 11 years ago, no polyps were found.  Colon cancer does not in her family.  She often has to push and strain to move her bowels.  She says sometimes she will use her hands to press on her perineum and that would make her bowels come out a little easier.  She has never tried fiber supplements for her chronic mild constipation.  She does not see blood in her stool.  She has intermittent GERD and dyspepsia.  She does not have dysphagia.  She is not losing weight.  She is not on any antiacid medicines.    Old Data Reviewed: She is a former patient of Dr. Raj [facility] gastroenterology.  I believe Dr. Darrick Penna performed a colonoscopy for her 02/2011 and found some hemorrhoids and a "redundant transverse colon".    Review of systems: Pertinent positive and negative review of systems were noted in the above HPI section. All other review negative.   Past Medical History:  Diagnosis Date   Abdominal pain, chronic, epigastric 07/05/2013   Anemia    GERD (gastroesophageal reflux disease)    H/O urinary retention    HTN (hypertension)    Hypertension     Past Surgical History:  Procedure Laterality Date   ABDOMINAL HYSTERECTOMY  1990   Menometrorragia.Endometriosis   APPENDECTOMY     CHOLECYSTECTOMY N/A 05/26/2016   Procedure: LAPAROSCOPIC CHOLECYSTECTOMY;  Surgeon: Franky Macho, MD;  Location: [facility] ORS;  Service: General;  Laterality: N/A;   COLONOSCOPY  03/04/2011   EGB:TDVVO internal hemorrhoids/Redundant transverse colon   ESOPHAGOGASTRODUODENOSCOPY N/A 07/11/2013   Procedure: ESOPHAGOGASTRODUODENOSCOPY (EGD);  Surgeon: West Bali, MD;  Location: [facility];  Service: Endoscopy;  Laterality: N/A;  1:45   KNEE SURGERY     both   TONSILLECTOMY      Current Outpatient  Medications  Medication Instructions   diltiazem (DILT-XR) 240 mg, Oral, Daily, No more refills will be approved by this provider as office is now permanently closed, patient will need to find another primary care provider.   EPINEPHrine (EPI-PEN) 0.3 mg, Intramuscular, As needed   Vitamin D3 5,000 Units, Oral, Daily    Allergies as of 04/16/2022 - Review Complete 04/16/2022  Allergen Reaction Noted   Iodinated contrast media Anaphylaxis 08/21/2020   Shellfish allergy Anaphylaxis 07/05/2013   Codeine Itching 02/04/2011   Prilosec [omeprazole]  07/20/2013   Latex Rash 07/05/2013    Family History  Problem Relation Age of Onset   Lung cancer Mother    Stroke Father    Colon cancer Neg Hx    Colon polyps Neg Hx     Social History   Socioeconomic History   Marital status: Married    Spouse name: Not on file   Number of children: Not on file   Years of education: Not on file   Highest education level: Not on file  Occupational History   Not on file  Tobacco Use   Smoking status: Never   Smokeless tobacco: Never  Vaping Use   Vaping Use: Never used  Substance and Sexual Activity   Alcohol use: No   Drug use: No   Sexual activity: Yes    Birth control/protection: Surgical  Other Topics Concern   Not on file  Social History Narrative   Married for  29 years.Retired,previously Runner, broadcasting/film/video.   Social Determinants of Health   Financial Resource Strain: Not on file  Food Insecurity: Not on file  Transportation Needs: Not on file  Physical Activity: Not on file  Stress: Not on file  Social Connections: Not on file  Intimate Partner Violence: Not on file     Physical Exam: Ht 5\' 5"  (1.651 m)   Wt 247 lb 4 oz (112.2 kg)   BMI 41.14 kg/m  Constitutional: generally well-appearing Psychiatric: alert and oriented x3 Eyes: extraocular movements intact Mouth: oral pharynx moist, no lesions Neck: supple no lymphadenopathy Cardiovascular: heart regular rate and  rhythm Lungs: clear to auscultation bilaterally Abdomen: soft, nontender, nondistended, no obvious ascites, no peritoneal signs, normal bowel sounds Extremities: no lower extremity edema bilaterally Skin: no lesions on visible extremities   Assessment and plan: 66 y.o. female with chronic mild constipation, routine risk for colon cancer, dyspepsia, GERD  I recommended she start taking fiber supplements on a once daily basis to see if that will help her mild chronic constipation.  She needs colonoscopy for colon cancer screening at the same time I recommended an upper endoscopy to check for H. pylori, peptic ulcer disease, large hiatal hernias that might be contributing to her GERD and dyspepsia.   Please see the "Patient Instructions" section for addition details about the plan.   78, MD [facility] 04/16/2022, 11:04 AM  Cc: 06/17/2022, MD  Total time on date of encounter was 46  minutes (this included time spent preparing to see the patient reviewing records; obtaining and/or reviewing separately obtained history; performing a medically appropriate exam and/or evaluation; counseling and educating the patient and family if present; ordering medications, tests or procedures if applicable; and documenting clinical information in the health record).

## 2022-06-03 ENCOUNTER — Encounter: Payer: Medicare HMO | Admitting: Gastroenterology

## 2022-08-08 ENCOUNTER — Encounter: Payer: Self-pay | Admitting: Gastroenterology

## 2022-08-18 IMAGING — MG DIGITAL SCREENING BILAT W/ TOMO W/ CAD
6 of 12 series · 6 of 36 positions shown · non-contrast
Comparison: Previous exam(s).

CLINICAL DATA: Screening.

EXAM:
DIGITAL SCREENING BILATERAL MAMMOGRAM WITH TOMO AND CAD

[L CC synth-2D (1 of 2)]
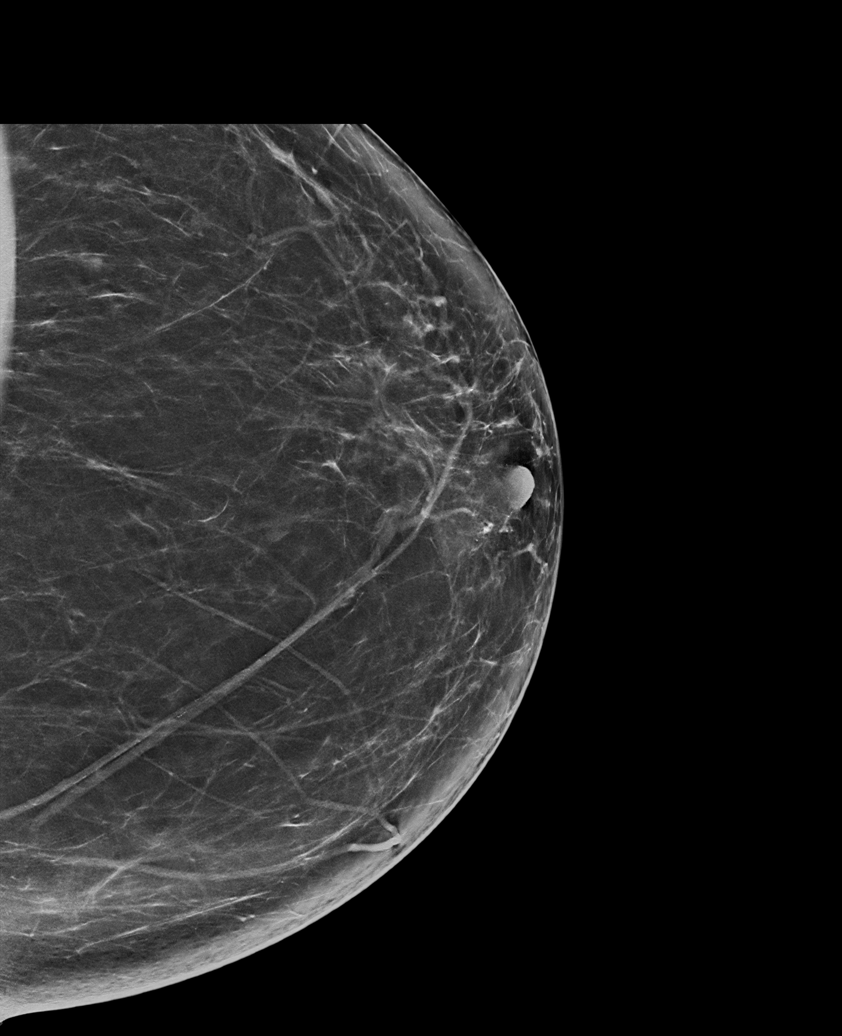

[L MLO synth-2D]
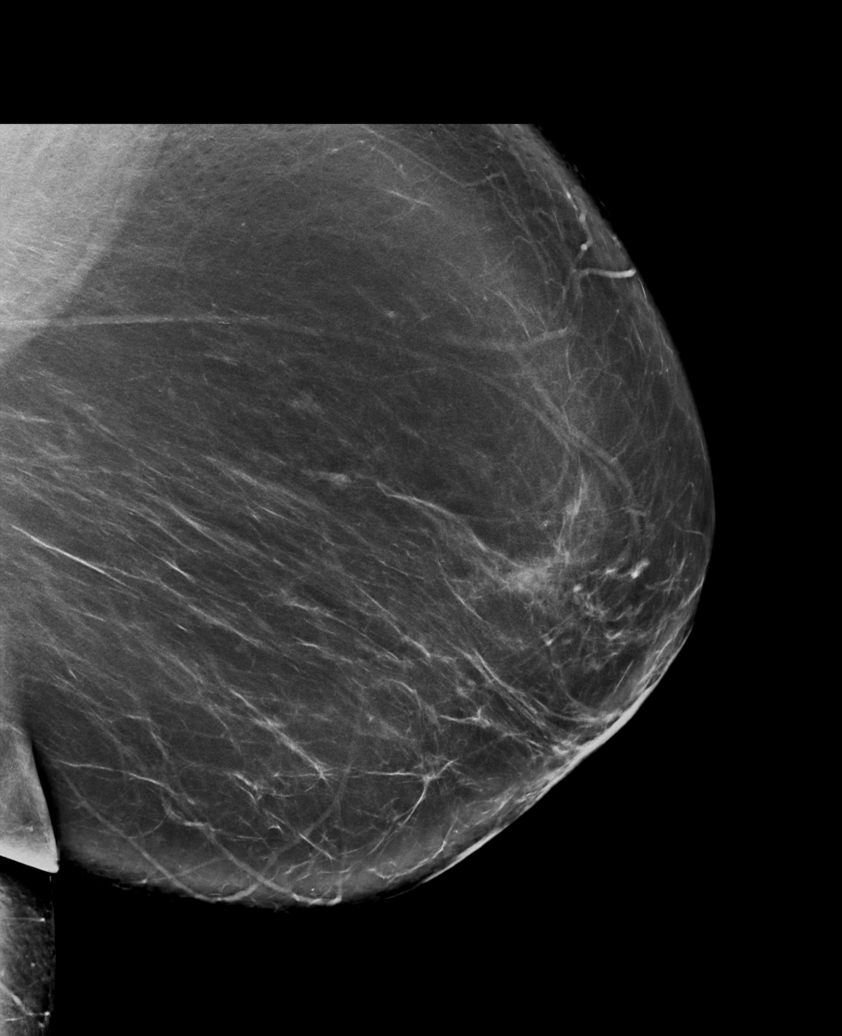

[R MLO synth-2D (1 of 2)]
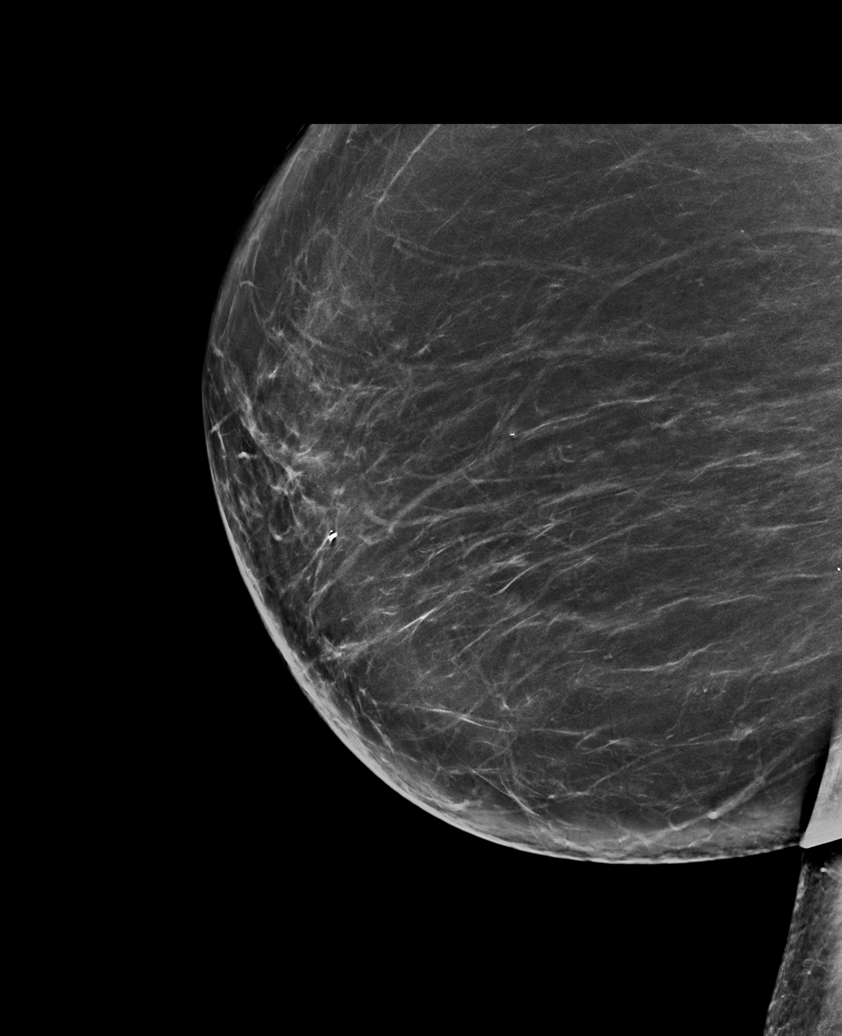

[R CC synth-2D]
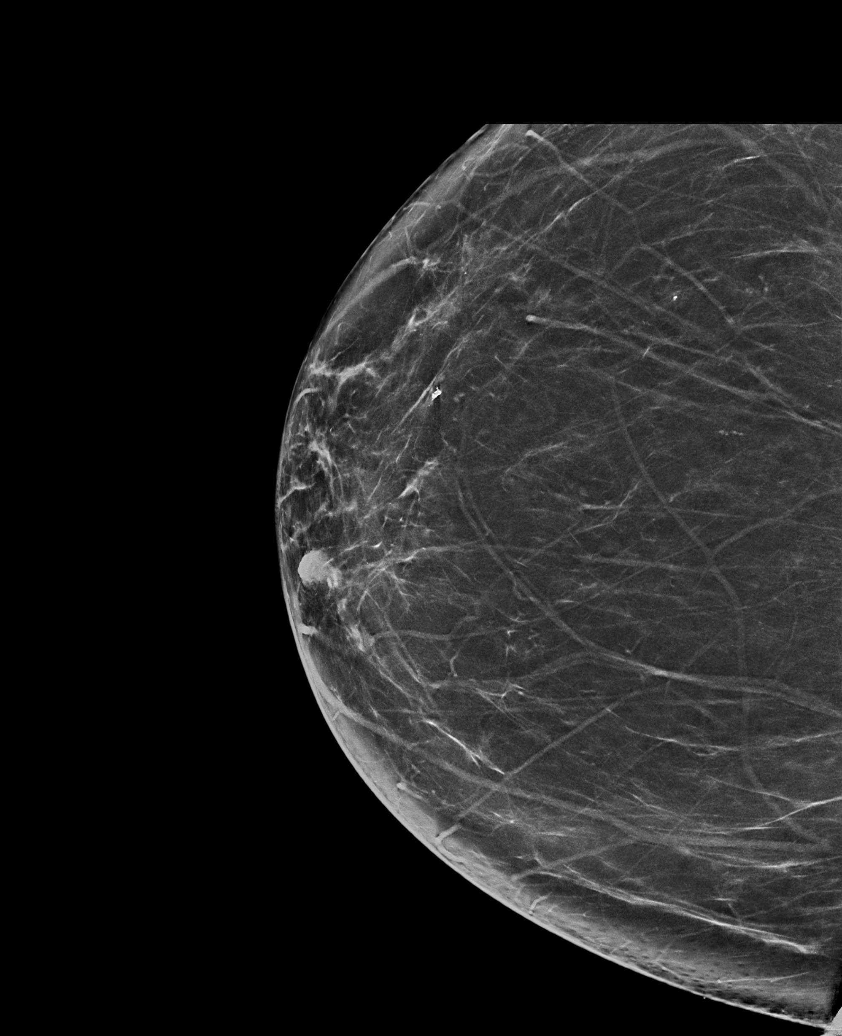

[R MLO synth-2D (2 of 2)]
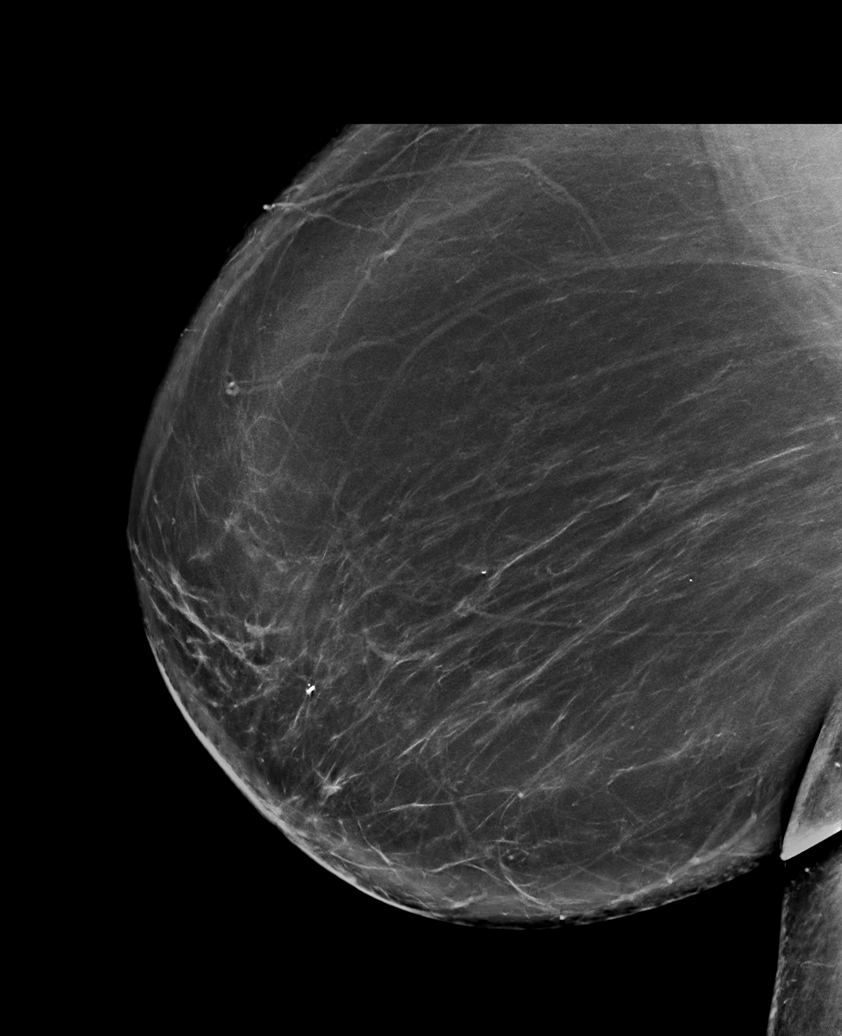

[L CC synth-2D (2 of 2)]
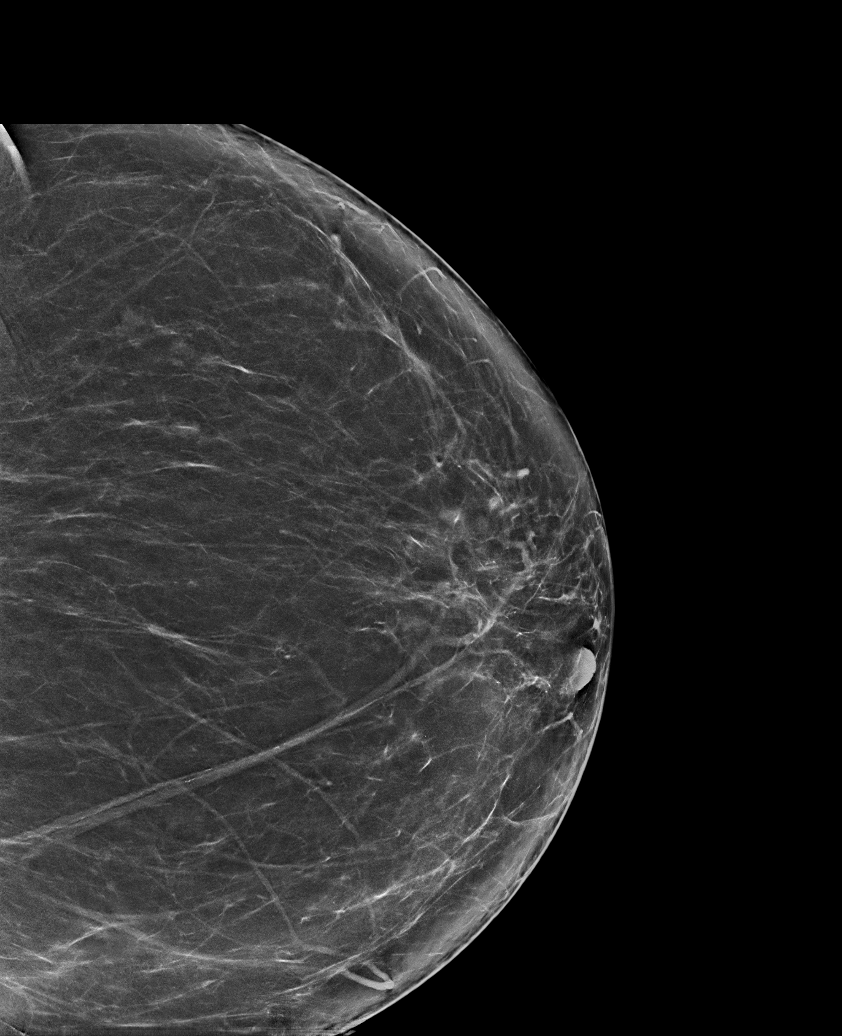

[6 of 36 positions shown; findings below may reference images not displayed]

ACR Breast Density Category b: There are scattered areas of
fibroglandular density.
FINDINGS: There are no findings suspicious for malignancy. Images were
processed with CAD.
IMPRESSION: No mammographic evidence of malignancy. A result letter of this
screening mammogram will be mailed directly to the patient.

RECOMMENDATION:
Screening mammogram in one year. (Code:[id number]-775)

BI-RADS CATEGORY  1: Negative.

## 2023-01-05 DIAGNOSIS — H04123 Dry eye syndrome of bilateral lacrimal glands: Secondary | ICD-10-CM | POA: Diagnosis not present

## 2023-07-07 DIAGNOSIS — Z1231 Encounter for screening mammogram for malignant neoplasm of breast: Secondary | ICD-10-CM | POA: Diagnosis not present

## 2023-07-28 DIAGNOSIS — I517 Cardiomegaly: Secondary | ICD-10-CM | POA: Diagnosis not present

## 2023-07-28 DIAGNOSIS — E785 Hyperlipidemia, unspecified: Secondary | ICD-10-CM | POA: Diagnosis not present

## 2023-07-28 DIAGNOSIS — I10 Essential (primary) hypertension: Secondary | ICD-10-CM | POA: Diagnosis not present

## 2023-07-28 DIAGNOSIS — Z1321 Encounter for screening for nutritional disorder: Secondary | ICD-10-CM | POA: Diagnosis not present

## 2023-07-28 DIAGNOSIS — Z1329 Encounter for screening for other suspected endocrine disorder: Secondary | ICD-10-CM | POA: Diagnosis not present

## 2023-07-28 DIAGNOSIS — Z131 Encounter for screening for diabetes mellitus: Secondary | ICD-10-CM | POA: Diagnosis not present

## 2023-07-28 DIAGNOSIS — Z01419 Encounter for gynecological examination (general) (routine) without abnormal findings: Secondary | ICD-10-CM | POA: Diagnosis not present

## 2023-09-16 NOTE — Progress Notes (Unsigned)
  Cardiology Office Note:  .   Date:  09/16/2023  ID:  Filbert Berthold, DOB [birth year], MRN [id number] PCP: Marcelle Overlie, MD  [facility] Providers Cardiologist:  None { Click to update primary MD,subspecialty MD or APP then REFRESH:1}   History of Present Illness: .   Evelyn Williams is a 67 y.o. female with history of HTN who presents for the evaluation of cardiomegaly at the request of Marcelle Overlie, MD.     Problem List HTN    ROS: All other ROS reviewed and negative. Pertinent positives noted in the HPI.     Studies Reviewed: Marland Kitchen       Physical Exam:   VS:  There were no vitals taken for this visit.   Wt Readings from Last 3 Encounters:  04/16/22 247 lb 4 oz (112.2 kg)  04/30/21 237 lb 6.4 oz (107.7 kg)  03/27/21 239 lb (108.4 kg)    GEN: Well nourished, well developed in no acute distress NECK: No JVD; No carotid bruits CARDIAC: ***RRR, no murmurs, rubs, gallops RESPIRATORY:  Clear to auscultation without rales, wheezing or rhonchi  ABDOMEN: Soft, non-tender, non-distended EXTREMITIES:  No edema; No deformity  ASSESSMENT AND PLAN: .   ***    {Are you ordering a CV Procedure (e.g. stress test, cath, DCCV, TEE, etc)?   Press F2        :[id number]}   Follow-up: No follow-ups on file.  Time Spent with Patient: I have spent a total of *** minutes caring for this patient today face to face, ordering and reviewing labs/tests, reviewing prior records/medical history, examining the patient, establishing an assessment and plan, communicating results/findings to the patient/family, and documenting in the medical record.   Signed, Lenna Gilford. Flora Lipps, MD, [facility]  [facility]  [address], [address] [place], [place] [address] [phone]  9:28 PM

## 2023-09-17 ENCOUNTER — Ambulatory Visit: Payer: Medicare HMO | Admitting: Cardiovascular Disease

## 2023-09-17 DIAGNOSIS — I517 Cardiomegaly: Secondary | ICD-10-CM

## 2023-11-15 NOTE — Progress Notes (Signed)
 Cardiology Office Note:  .   Date:  11/17/2023  ID:  Heron Evelyn Williams, DOB 1956/07/25, MRN [id number] PCP: Mat Browning, MD  [facility] Providers Cardiologist:  None { History of Present Illness: .   Evelyn Williams is a 68 y.o. female with history of HTN who presents for the evaluation of cardiomegaly at the request of Mat Browning, MD.    History of Present Illness   IESHA Williams is a 68 year old female with hypertension who presents for the evaluation of cardiomegaly. She is accompanied by her husband.  She has a history of cardiomegaly, identified approximately three years ago as a level one enlargement on the left side of her heart. An echocardiogram performed in 2021 was normal. She is concerned about potential worsening of this condition due to recent symptoms.  For the past year, she has experienced shortness of breath, particularly noticeable when walking around her two-story house. She becomes winded after climbing stairs and walking across rooms. Additionally, she reports occasional twinges in her chest, which occur sporadically and are not predictable. These sensations have been present for about a year. No leg swelling is noted.  She has a history of hypertension and is currently taking diltiazem  240 mg daily. She mentions that she took herself off this medication for a period but has since resumed it. She feels that it is not effectively managing her blood pressure. Her blood pressure was noted to be elevated during the visit. No history of heart attacks or strokes. She is not diabetic.  She mentions a history of elevated cholesterol but has resisted taking statins due to concerns about long-term use. She is aware that her cholesterol levels may be high but has not been on any cholesterol-lowering medications.  She experiences puffiness over her eyelids and swelling under her eyes, which she attributes to lack of sleep or possibly allergies. She also reports  hearing her pulse in her right ear, which is bothersome, especially when lying on her left side.   No tobacco, etoh, or drugs. Retired. 3 Children 7 grandchildren. No regular exercise. No family history of heart disease per her report.       T chol 199, TG 87, LDL 121, HDL 62 Cr 0.80 TSH  1.01     Problem List HTN    ROS: All other ROS reviewed and negative. Pertinent positives noted in the HPI.     Studies Reviewed: SABRA   EKG Interpretation Date/Time:  Tuesday November 17 2023 08:25:25 EST Ventricular Rate:  98 PR Interval:  174 QRS Duration:  68 QT Interval:  346 QTC Calculation: 441 R Axis:   17  Text Interpretation: Normal sinus rhythm Nonspecific T wave abnormality Confirmed by Barbaraann Kotyk [phone] on 11/17/2023 8:26:04 AM    TTE 08/31/2020  1. Left ventricular ejection fraction, by estimation, is 60 to 65%. The  left ventricle has normal function. The left ventricle has no regional  wall motion abnormalities. There is mild left ventricular hypertrophy.  Left ventricular diastolic parameters  are consistent with Grade I diastolic dysfunction (impaired relaxation).   2. Right ventricular systolic function is normal. The right ventricular  size is normal.   3. The mitral valve is normal in structure. No evidence of mitral valve  regurgitation. No evidence of mitral stenosis.   4. The aortic valve is tricuspid. Aortic valve regurgitation is not  visualized. No aortic stenosis is present.   5. The inferior vena cava is normal in size with  greater than 50%  respiratory variability, suggesting right atrial pressure of 3 mmHg.  Physical Exam:   VS:  BP (!) 152/90 (BP Location: Right Arm, Patient Position: Sitting, Cuff Size: Normal)   Pulse 98   Ht 5' 5.5 (1.664 m)   Wt 262 lb (118.8 kg)   SpO2 96%   BMI 42.94 kg/m    Wt Readings from Last 3 Encounters:  11/17/23 262 lb (118.8 kg)  04/16/22 247 lb 4 oz (112.2 kg)  04/30/21 237 lb 6.4 oz (107.7 kg)    GEN: Well  nourished, well developed in no acute distress NECK: No JVD; No carotid bruits CARDIAC: RRR, no murmurs, rubs, gallops RESPIRATORY:  Clear to auscultation without rales, wheezing or rhonchi  ABDOMEN: Soft, non-tender, non-distended EXTREMITIES:  No edema; No deformity  ASSESSMENT AND PLAN: .   Assessment and Plan    SOB Chest pain Cardiomegaly? Self-reported history of cardiomegaly with a normal echocardiogram in 2021. Currently experiencing shortness of breath and occasional chest twinges for the past year. No history of heart attack or stroke. -Order echocardiogram to reassess for cardiomegaly. -Order nuclear medicine stress test to exclude obstructive CAD.  Hypertension Elevated blood pressure on current medication (Diltiazem  240mg  daily). -Discontinue Diltiazem . -Start Losartan  50mg  daily. -Check BMP to monitor kidney function after medication change.  Hyperlipidemia History of elevated cholesterol, patient is fasting. -Check lipid profile to assess need for statin therapy.  Obesity BMI of 43, shortness of breath could be related to obesity. -Recommend weight loss and exercise.  Follow-up -See APP in 3 months to re-evaluate blood pressure. -Annual follow-up with me.          Informed Consent   Shared Decision Making/Informed Consent The risks [chest pain, shortness of breath, cardiac arrhythmias, dizziness, blood pressure fluctuations, myocardial infarction, stroke/transient ischemic attack, nausea, vomiting, allergic reaction, radiation exposure, metallic taste sensation and life-threatening complications (estimated to be 1 in 10,000)], benefits (risk stratification, diagnosing coronary artery disease, treatment guidance) and alternatives of a nuclear stress test were discussed in detail with [name] and she agrees to proceed.      Follow-up: Return in about 3 months (around 02/14/2024).  Signed, Darryle DASEN. Barbaraann, MD, [facility]  [facility]  [address] [place], [place] [address] [phone]  8:57 AM

## 2023-11-17 ENCOUNTER — Encounter: Payer: Self-pay | Admitting: Cardiovascular Disease

## 2023-11-17 ENCOUNTER — Ambulatory Visit: Payer: Medicare HMO | Attending: Cardiovascular Disease | Admitting: Cardiovascular Disease

## 2023-11-17 VITALS — BP 152/90 | HR 98 | Ht 65.5 in | Wt 262.0 lb

## 2023-11-17 DIAGNOSIS — I10 Essential (primary) hypertension: Secondary | ICD-10-CM

## 2023-11-17 DIAGNOSIS — I517 Cardiomegaly: Secondary | ICD-10-CM

## 2023-11-17 DIAGNOSIS — R0602 Shortness of breath: Secondary | ICD-10-CM | POA: Diagnosis not present

## 2023-11-17 DIAGNOSIS — R072 Precordial pain: Secondary | ICD-10-CM | POA: Diagnosis not present

## 2023-11-17 DIAGNOSIS — E782 Mixed hyperlipidemia: Secondary | ICD-10-CM

## 2023-11-17 MED ORDER — LOSARTAN POTASSIUM 50 MG PO TABS: 50.0000 mg | ORAL_TABLET | Freq: Every day | ORAL | 3 refills | Status: DC

## 2023-11-17 NOTE — Patient Instructions (Signed)
 Medication Instructions:  STOP diltiazem  (DILT-XR) 240 MG 24 hr capsule  - START LOSARTAN  50MG  DAILY    *If you need a refill on your cardiac medications before your next appointment, please call your pharmacy*   Lab Work: LIPIDS  BMET  Bnat Peptide    If you have labs (blood work) drawn today and your tests are completely normal, you will receive your results only by: MyChart Message (if you have MyChart) OR A paper copy in the mail If you have any lab test that is abnormal or we need to change your treatment, we will call you to review the results.   Testing/Procedures: Echo will be scheduled at [address].  Your physician has requested that you have an echocardiogram. Echocardiography is a painless test that uses sound waves to create images of your heart. It provides your doctor with information about the size and shape of your heart and how well your heart's chambers and valves are working. This procedure takes approximately one hour. There are no restrictions for this procedure. Please do NOT wear cologne, perfume, aftershave, or lotions (deodorant is allowed). Please arrive 15 minutes prior to your appointment time.   Dr. Barbaraann has ordered a Lexiscan Myocardial Perfusion Imaging Study.  Please arrive 15 minutes prior to your appointment time for registration and insurance purposes.   The test will take approximately 3 to 4 hours to complete; you may bring reading material.  If someone comes with you to your appointment, they will need to remain in the main lobby due to limited space in the testing area. **If you are pregnant or breastfeeding, please notify the nuclear lab prior to your appointment**   How to prepare for your Myocardial Perfusion Test: Do not eat or drink 3 hours prior to your test, except you may have water . Do not consume products containing caffeine (regular or decaffeinated) 12 hours prior to your test. (ex: coffee, chocolate, sodas,  tea). Do wear comfortable clothes (no dresses or overalls) and walking shoes, tennis shoes preferred (No heels or open toe shoes are allowed). Do NOT wear cologne, perfume, aftershave, or lotions (deodorant is allowed). If you use an inhaler, use it the AM of your test and bring it with you.  If you use a nebulizer, use it the AM of your test.  If these instructions are not followed, your test will have to be rescheduled.    Follow-Up: At [facility], you and your health needs are our priority.  As part of our continuing mission to provide you with exceptional heart care, we have created designated Provider Care Teams.  These Care Teams include your primary Cardiologist (physician) and Advanced Practice Providers (APPs -  Physician Assistants and Nurse Practitioners) who all work together to provide you with the care you need, when you need it.  We recommend signing up for the patient portal called MyChart.  Sign up information is provided on this After Visit Summary.  MyChart is used to connect with patients for Virtual Visits (Telemedicine).  Patients are able to view lab/test results, encounter notes, upcoming appointments, etc.  Non-urgent messages can be sent to your provider as well.   To learn more about what you can do with MyChart, go to [email or web address].    Your next appointment:   3 month(s)  The format for your next appointment:   In Person  Provider:   Josefa Beauvais, FNP, Jon Hails, PA-C, Callie Goodrich, PA-C, Kathleen Johnson, PA-C, Delon Holts,  PA-C, Lamarr Satterfield, DNP, ANP, or Hao Meng, PA-C    Then, None will plan to see you again in 1 year(s).   Other Instructions

## 2023-11-18 LAB — LIPID PANEL
Chol/HDL Ratio: 3.3 {ratio} (ref 0.0–4.4)
Cholesterol, Total: 203 mg/dL — ABNORMAL HIGH (ref 100–199)
HDL: 62 mg/dL (ref >39)
LDL Chol Calc (NIH): 124 mg/dL — ABNORMAL HIGH (ref 0–99)
Triglycerides: 95 mg/dL (ref 0–149)
VLDL Cholesterol Cal: 17 mg/dL (ref 5–40)

## 2023-11-18 LAB — BASIC METABOLIC PANEL
BUN/Creatinine Ratio: 14 (ref 12–28)
BUN: 12 mg/dL (ref 8–27)
CO2: 23 mmol/L (ref 20–29)
Calcium: 9.5 mg/dL (ref 8.7–10.3)
Chloride: 103 mmol/L (ref 96–106)
Creatinine, Ser: 0.84 mg/dL (ref 0.57–1.00)
Glucose: 111 mg/dL — ABNORMAL HIGH (ref 70–99)
Potassium: 4.9 mmol/L (ref 3.5–5.2)
Sodium: 141 mmol/L (ref 134–144)
eGFR: 76 mL/min/{1.73_m2} (ref >59)

## 2023-11-18 LAB — BRAIN NATRIURETIC PEPTIDE: BNP: 31.4 pg/mL (ref 0.0–100.0)

## 2023-11-20 ENCOUNTER — Telehealth: Payer: Self-pay | Admitting: Cardiovascular Disease

## 2023-11-20 NOTE — Telephone Encounter (Signed)
 Called and spoke to patient. Verified name and DOB. Patient had concerns about her Lexiscan Myocardial Perfusion Imaging Study. I informed patient what to expect during the test and answered all questions. No further questions at this time.

## 2023-11-20 NOTE — Telephone Encounter (Signed)
 Patient is concerned about what is involved in doing her upcoming MYOCARDIAL PERFUSION test. Patient want a call back to discuss this test and her allergic reactions to certain test materials.

## 2023-12-01 ENCOUNTER — Telehealth (HOSPITAL_COMMUNITY): Payer: Self-pay | Admitting: *Deleted

## 2023-12-01 NOTE — Telephone Encounter (Signed)
 Left message on voicemail per DPR in reference to upcoming appointment scheduled on  12/09/23 with detailed instructions given per Myocardial Perfusion Study Information Sheet for the test. LM to arrive 15 minutes early, and that it is imperative to arrive on time for appointment to keep from having the test rescheduled. If you need to cancel or reschedule your appointment, please call the office within 24 hours of your appointment. Failure to do so may result in a cancellation of your appointment, and a $50 no show fee. Phone number given for call back for any questions. Ricky Ala, RN

## 2023-12-08 ENCOUNTER — Ambulatory Visit (HOSPITAL_COMMUNITY): Payer: Medicare HMO | Attending: Cardiovascular Disease

## 2023-12-08 ENCOUNTER — Ambulatory Visit (HOSPITAL_COMMUNITY): Payer: Medicare HMO

## 2023-12-08 DIAGNOSIS — R0602 Shortness of breath: Secondary | ICD-10-CM | POA: Diagnosis not present

## 2023-12-08 LAB — ECHOCARDIOGRAM COMPLETE
Area-P 1/2: 4.63 cm2
S' Lateral: 3.2 cm

## 2023-12-09 ENCOUNTER — Ambulatory Visit (HOSPITAL_COMMUNITY): Payer: Medicare HMO

## 2023-12-10 ENCOUNTER — Ambulatory Visit (HOSPITAL_COMMUNITY): Payer: Medicare HMO

## 2023-12-18 ENCOUNTER — Telehealth: Payer: Self-pay | Admitting: Cardiovascular Disease

## 2023-12-18 NOTE — Telephone Encounter (Signed)
 Pt calling back she has questions about her Echo results

## 2023-12-18 NOTE — Telephone Encounter (Signed)
 Sande Rives, MD 12/08/2023  6:01 PM EST     Please let her know her echo was normal. She does not have my chart. Normal heart function. Normal valves.   [name], MD, [facility]  [facility] [address] [place], [place] [address] [phone] 6:01 PM   Patient identification verified by 2 forms. [name], RN    Called and spoke to patient  Patient states:   -she did not hear Echo results properly during last call   -was previously told she had a level 1 enlargement of her heart on previous Echo   -she would like to know if she is still at a level one or if there are any changes to the enlargement   -she would like to lose weight and have some surgeries, would like to know prior to initiating Informed patient message sent to Dr. Flora Lipps  Patient verbalized understanding, no questions at this time

## 2023-12-21 NOTE — Telephone Encounter (Signed)
 Evelyn Rives, MD  You; [name], RN3 days ago    Her heart is normal.  [name], MD, [facility]  [facility] [address] [place], [place] [address] [phone] 8:46 PM   Patient identification verified by 2 forms. [name], RN    Called and spoke to patient  Relayed provider message  Informed patient for preop request, have surgeon office fax preop clearance to office [phone]) Attn preop  RN provided patient with text link to set up mychart  Patient verbalized understanding, no questions at this time

## 2024-02-10 NOTE — Progress Notes (Unsigned)
 Cardiology Clinic Note   Patient Name: Evelyn Williams Date of Encounter: 02/16/2024  Primary Care Provider:  Thurman Flores, MD Primary Cardiologist:  None  Patient Profile    Evelyn Williams 68 year old female presents to the clinic today for review of her echocardiogram.  Past Medical History    Past Medical History:  Diagnosis Date   Abdominal pain, chronic, epigastric 07/05/2013   Anemia    Asthma    none since age 66   Enlarged heart    Gallstones    GERD (gastroesophageal reflux disease)    H/O urinary retention    HLD (hyperlipidemia)    Hypertension    Obesity    Past Surgical History:  Procedure Laterality Date   ABDOMINAL HYSTERECTOMY  1990   Menometrorragia.Endometriosis   APPENDECTOMY     CHOLECYSTECTOMY N/A 05/26/2016   Procedure: LAPAROSCOPIC CHOLECYSTECTOMY;  Surgeon: Alanda Allegra, MD;  Location: [facility];  Service: General;  Laterality: N/A;   COLONOSCOPY  03/04/2011   QIO:NGEXB internal hemorrhoids/Redundant transverse colon   ESOPHAGOGASTRODUODENOSCOPY N/A 07/11/2013   Procedure: ESOPHAGOGASTRODUODENOSCOPY (EGD);  Surgeon: Alyce Jubilee, MD;  Location: [facility];  Service: Endoscopy;  Laterality: N/A;  1:45   KNEE ARTHROSCOPY Bilateral    TONSILLECTOMY      Allergies  Allergies  Allergen Reactions   Iodinated Contrast Media Anaphylaxis   Shellfish Allergy Anaphylaxis   Codeine Itching   Prilosec [Omeprazole ]     ABDOMINAL PAIN   Latex Rash    History of Present Illness    Evelyn Williams has PMH of cardiomegaly, precordial pain, obesity, hyperlipidemia, and hypertension.  She was seen in follow-up by Dr. Rolm Clos on 11/17/2023.  During that time she was accompanied by her husband.  Her echocardiogram 2021 was normal.  She was concerned about potential worsening of her shortness of breath due to her recent symptoms.  Over the prior year she had experienced increased shortness of breath which was more noticeable with walking around her  two-story house.  She was winded with climbing stairs and walking across rooms.  She noted occasional twinges in her chest.  She noted that they would occur sporadically and were not predictable.  The sensations have been present for about a year.  No lower extremity edema was noted.  She was taking diltiazem  240 mg daily.  She reported a history of high cholesterol.  She was not on statin therapy due to concern for long-term use.  She also noted some puffiness over her eyelids and swelling under her eyes which she attributed to lack of sleep and allergies.  She denied tobacco, EtOH or drug use.  She denied regular exercise she denied family history of heart disease.  Echocardiogram was ordered and was normal.  She presents to the clinic today for follow-up evaluation and states she got lost on the way to have the appointment.  She reports that she had to stop and ask for directions.  She then rushed to get to the office today.  Initially her blood pressure was very high.  On recheck it was 148/96.  She reported hair loss, sleep disturbances, cough, and auditory buzzing while on losartan .  I we will discontinue the medication and start amlodipine  2.5 mg daily.  She reports that intermittently she was able to note blood pressures in the 120s over 70s at home.  We reviewed the importance of avoiding secondary causes of hypertension and the importance of weight loss.  We reviewed her echocardiogram and  she expressed understanding.  I will plan to see her back in 1 to 2 months..  Today she denies chest pain, increased shortness of breath, lower extremity edema.   Home Medications    Prior to Admission medications   Medication Sig Start Date End Date Taking? Authorizing Provider  Cholecalciferol (VITAMIN D3) 125 MCG (5000 UT) CAPS Take 5,000 Units by mouth daily. Patient not taking: Reported on 11/17/2023    [provider]  EPINEPHrine  0.3 mg/0.3 mL IJ SOAJ injection Inject 0.3 mg into the muscle  as needed for anaphylaxis. Patient not taking: Reported on 11/17/2023 11/28/20   Zorita Hiss, NP  losartan  (COZAAR ) 50 MG tablet Take 1 tablet (50 mg total) by mouth daily. 11/17/23   O'NealCathay Clonts, MD  methylcellulose (CITRUCEL) oral powder Take 1 packet by mouth daily. Patient not taking: Reported on 11/17/2023 04/16/22   Janel Medford, MD  Na Sulfate-K Sulfate-Mg Sulf (SUPREP BOWEL PREP KIT) 17.5-3.13-1.6 GM/177ML SOLN Take 1 kit by mouth as directed. Patient not taking: Reported on 11/17/2023 04/16/22   Janel Medford, MD    Family History    Family History  Problem Relation Age of Onset   Lung cancer Mother    Heart failure Mother    Stroke Father    Prostate cancer Father    Hypertension Father    Hyperlipidemia Father    Cancer Brother        rare lymphoma   Colon cancer Neg Hx    Colon polyps Neg Hx    She indicated that her mother is deceased. She indicated that her father is alive. She indicated that her brother is alive. She indicated that her maternal grandmother is deceased. She indicated that her maternal grandfather is deceased. She indicated that her paternal grandmother is deceased. She indicated that her paternal grandfather is deceased. She indicated that both of her daughters are alive. She indicated that the status of her neg hx is unknown.  Social History    Social History   Socioeconomic History   Marital status: Married    Spouse name: Not on file   Number of children: 2   Years of education: Not on file   Highest education level: Not on file  Occupational History   Occupation: retired   Occupation: Retired [employer]  Tobacco Use   Smoking status: Never   Smokeless tobacco: Never  Vaping Use   Vaping status: Never Used  Substance and Sexual Activity   Alcohol use: No   Drug use: No   Sexual activity: Yes    Birth control/protection: Surgical  Other Topics Concern   Not on file  Social History Narrative   Married for 29 years.Retired,previously  [employer], broadcasting/film/video.   Social Drivers of Corporate investment banker Strain: Not on file  Food Insecurity: Not on file  Transportation Needs: Not on file  Physical Activity: Not on file  Stress: Not on file  Social Connections: Not on file  Intimate Partner Violence: Not on file     Review of Systems    General:  No chills, fever, night sweats or weight changes.  Cardiovascular:  No chest pain, dyspnea on exertion, edema, orthopnea, palpitations, paroxysmal nocturnal dyspnea. Dermatological: No rash, lesions/masses Respiratory: No cough, dyspnea Urologic: No hematuria, dysuria Abdominal:   No nausea, vomiting, diarrhea, bright red blood per rectum, melena, or hematemesis Neurologic:  No visual changes, wkns, changes in mental status. All other systems reviewed and are otherwise negative except as noted  above.  Physical Exam    VS:  BP (!) 148/96   Pulse 98   Ht 5' 5.5" (1.664 m)   Wt 270 lb (122.5 kg)   SpO2 95%   BMI 44.25 kg/m  , BMI Body mass index is 44.25 kg/m. GEN: Well nourished, well developed, in no acute distress. HEENT: normal. Neck: Supple, no JVD, carotid bruits, or masses. Cardiac: RRR, no murmurs, rubs, or gallops. No clubbing, cyanosis, edema.  Radials/DP/PT 2+ and equal bilaterally.  Respiratory:  Respirations regular and unlabored, clear to auscultation bilaterally. GI: Soft, nontender, nondistended, BS + x 4. MS: no deformity or atrophy. Skin: warm and dry, no rash. Neuro:  Strength and sensation are intact. Psych: Normal affect.  Accessory Clinical Findings    Recent Labs: 11/17/2023: BNP 31.4; BUN 12; Creatinine, Ser 0.84; Potassium 4.9; Sodium 141   Recent Lipid Panel    Component Value Date/Time   CHOL 203 (H) 11/17/2023 0933   TRIG 95 11/17/2023 0933   HDL 62 11/17/2023 0933   CHOLHDL 3.3 11/17/2023 0933   CHOLHDL 3.6 08/21/2020 1101   LDLCALC 124 (H) 11/17/2023 0933   LDLCALC 125 (H) 08/21/2020 1101    HYPERTENSION CONTROL Vitals:   02/16/24  1203 02/16/24 1239  BP: (!) 148/96 (!) 148/96    The patient's blood pressure is elevated above target today.  In order to address the patient's elevated BP: Blood pressure will be monitored at home to determine if medication changes need to be made.; A new medication was prescribed today.; The blood pressure is usually elevated in clinic.  Blood pressures monitored at home have been optimal.       ECG personally reviewed by me today-none today.     Echocardiogram 12/08/2023  IMPRESSIONS   1. Left ventricular ejection fraction by 3D volume is 61 %. The left ventricle has normal function. The left ventricle has no regional wall motion abnormalities. There is mild left ventricular hypertrophy. Left ventricular diastolic parameters are  consistent with Grade I diastolic dysfunction (impaired relaxation). The average left ventricular global longitudinal strain is -18.8 %. The global longitudinal strain is normal. 2. Right ventricular systolic function is normal. The right ventricular size is normal. 3. The mitral valve is normal in structure. No evidence of mitral valve regurgitation. No evidence of mitral stenosis. 4. The aortic valve is normal in structure. Aortic valve regurgitation is not visualized. No aortic stenosis is present. 5. The inferior vena cava is normal in size with greater than 50% respiratory variability, suggesting right atrial pressure of 3 mmHg.  Comparison(s): No significant change from prior study. Prior images reviewed side by side.  FINDINGS Left Ventricle: Left ventricular ejection fraction by 3D volume is 61 %. The left ventricle has normal function. The left ventricle has no regional wall motion abnormalities. The average left ventricular global longitudinal strain is -18.8 %. Strain was performed and the global longitudinal strain is normal. The left ventricular internal cavity size was normal in size. There is mild left ventricular hypertrophy. Left ventricular  diastolic parameters are consistent with Grade I diastolic dysfunction  (impaired relaxation).  Right Ventricle: The right ventricular size is normal. No increase in right ventricular wall thickness. Right ventricular systolic function is normal.  Left Atrium: Left atrial size was normal in size.  Right Atrium: Right atrial size was normal in size.  Pericardium: There is no evidence of pericardial effusion.  Mitral Valve: The mitral valve is normal in structure. No evidence of mitral valve regurgitation. No  evidence of mitral valve stenosis.  Tricuspid Valve: The tricuspid valve is normal in structure. Tricuspid valve regurgitation is not demonstrated. No evidence of tricuspid stenosis.  Aortic Valve: The aortic valve is normal in structure. Aortic valve regurgitation is not visualized. No aortic stenosis is present.  Pulmonic Valve: The pulmonic valve was normal in structure. Pulmonic valve regurgitation is not visualized. No evidence of pulmonic stenosis.  Aorta: The aortic root is normal in size and structure.  Venous: The inferior vena cava is normal in size with greater than 50% respiratory variability, suggesting right atrial pressure of 3 mmHg.  IAS/Shunts: No atrial level shunt detected by color flow Doppler.  Additional Comments: 3D was performed not requiring image post processing on an independent workstation and was normal.    Assessment & Plan   1.  Shortness of breath-continues to note some increased work of breathing with increased physical activity.  Echocardiogram reassuring.  Details above. Has lost about 40 lbs previously noted improvement with her breathing. Increase physical activity as tolerated. Encouraged weight loss Reassured that her symptoms were not related to cardiac issues.  Cardiomegaly-mild LVH noted on echocardiogram.  Echocardiogram reviewed Maintain good blood pressure control Heart healthy low-sodium diet  Hyperlipidemia-LDL 124 on 11/17/2023.    Increase physical activity as tolerated High-fiber diet   Essential hypertension-BP today 148/96. 120's systolic at home.  Brings in home blood pressure cuff which is a wrist cuff.  I recommended that she get an Omron upper arm cuff.  I recommended that she take her blood pressure 1 hour after taking her medication. Maintain blood pressure log Stop losartan  Start 2.5 mg of Amlodipine  Heart healthy low-sodium diet-salty 6 diet sheet given Secondary causes of hypertension reviewed.  Disposition: Follow-up with Dr. Rolm Clos or me in 1-2 months.   Chet Cota. Elin Fenley NP-C     02/16/2024, 12:39 PM [facility] [address] Office [phone] Fax [phone]    I spent 15 minutes examining this patient, reviewing medications, and using patient centered shared decision making involving their cardiac care.   I spent  20 minutes reviewing past medical history,  medications, and prior cardiac tests.

## 2024-02-12 ENCOUNTER — Other Ambulatory Visit: Payer: Self-pay | Admitting: Cardiovascular Disease

## 2024-02-16 ENCOUNTER — Encounter: Payer: Self-pay | Admitting: General Practice

## 2024-02-16 ENCOUNTER — Ambulatory Visit: Payer: Medicare HMO | Attending: General Practice | Admitting: General Practice

## 2024-02-16 VITALS — BP 148/96 | HR 98 | Ht 65.5 in | Wt 270.0 lb

## 2024-02-16 DIAGNOSIS — E782 Mixed hyperlipidemia: Secondary | ICD-10-CM | POA: Diagnosis not present

## 2024-02-16 DIAGNOSIS — I10 Essential (primary) hypertension: Secondary | ICD-10-CM

## 2024-02-16 DIAGNOSIS — I517 Cardiomegaly: Secondary | ICD-10-CM

## 2024-02-16 DIAGNOSIS — R0602 Shortness of breath: Secondary | ICD-10-CM | POA: Diagnosis not present

## 2024-02-16 MED ORDER — AMLODIPINE BESYLATE 2.5 MG PO TABS: 2.5000 mg | ORAL_TABLET | Freq: Every day | ORAL | 0 refills | Status: DC

## 2024-02-16 NOTE — Addendum Note (Signed)
 Addended by: Herbie Loll on: 02/16/2024 01:35 PM   Modules accepted: Orders

## 2024-02-16 NOTE — Patient Instructions (Addendum)
 Medication Instructions:  STOP Lorsartan 50mg .  START Amlodipine  2.5mg  daily. This medication has been sent to your pharmacy.  Increase physical activity.  *If you need a refill on your cardiac medications before your next appointment, please call your pharmacy*   Lab Work: No labs were ordered during today's visit.  If you have labs (blood work) drawn today and your tests are completely normal, you will receive your results only by: MyChart Message (if you have MyChart) OR A paper copy in the mail If you have any lab test that is abnormal or we need to change your treatment, we will call you to review the results.   Testing/Procedures: No procedures were ordered during today's visit.    Follow-Up: At [facility], you and your health needs are our priority.  As part of our continuing mission to provide you with exceptional heart care, we have created designated Provider Care Teams.  These Care Teams include your primary Cardiologist (physician) and Advanced Practice Providers (APPs -  Physician Assistants and Nurse Practitioners) who all work together to provide you with the care you need, when you need it.  We recommend signing up for the patient portal called "MyChart".  Sign up information is provided on this After Visit Summary.  MyChart is used to connect with patients for Virtual Visits (Telemedicine).  Patients are able to view lab/test results, encounter notes, upcoming appointments, etc.  Non-urgent messages can be sent to your provider as well.   To learn more about what you can do with MyChart, go to [email or web address].    Your next appointment:   1 month(s)  Provider:   Lawana Pray, NP          Other Instructions  [facility]:     Thank you for choosing [facility]!

## 2024-03-08 ENCOUNTER — Other Ambulatory Visit: Payer: Self-pay

## 2024-03-08 ENCOUNTER — Telehealth: Payer: Self-pay | Admitting: Cardiovascular Disease

## 2024-03-08 DIAGNOSIS — I10 Essential (primary) hypertension: Secondary | ICD-10-CM

## 2024-03-08 NOTE — Telephone Encounter (Signed)
 Pt c/o swelling/edema: STAT if pt has developed SOB within 24 hours  If swelling, where is the swelling located? Both legs, a little below and above the knee. Especially in ankles  How much weight have you gained and in what time span? Has not weighed  Have you gained 2 pounds in a day or 5 pounds in a week? Unsure  Do you have a log of your daily weights (if so, list)? No   Are you currently taking a fluid pill? No   Are you currently SOB? Not more than usual   Have you traveled recently in a car or plane for an extended period of time? Yes. 05/16 took a 4 hrs trip, but stopped twice.  Patient is also c/o dehydration, back pain, skin being extremely dry, & not urinating as often as she should/normally does with increased fluid intake. Reports she has noticed all of this over the past week. Please advise.

## 2024-03-08 NOTE — Telephone Encounter (Signed)
 Spoke with patient and shared Dr. Edsel Grace response:   Elevate legs and get her in for appointment with us  or PCP in the next week to evaluate. Difficult to evaluate this over phone. -W    Patient states her PCP passed away and she needs to establish with a new provider. Offered next available appt with Charles Connor, NP on 5/28, but patient declined stating this is her anniversary. She states she will elevate her legs and "give it to God." She will keep us  updated and plan to F/U at scheduled appt on 6/17. Patient expressed appreciation for follow-up.

## 2024-03-08 NOTE — Telephone Encounter (Signed)
 Patient reports for the past week or so she has had swelling in her ankles and up to her knees. She denies any increased SOB. She has been very active over the weekend walking through museums in [place] . She took a 4 hour trip on 5/16 with 2 stops.  She reports drinking 128-144 oz of water  daily. She denies added salt in diet. She also reports she is not urinating as frequently as usual, and has pain in back from shoulders to right side (around kidney), and skin is dry despite using lotion.  She was switched from losartan  to amlodipine  on 02/16/24. BMET was ordered on 5/6, released to [facility]. Patient also reports she has some puffiness over her right eye as well.  Will forward to Dr. Rolm Clos and Lawana Pray, NP to review and advise.

## 2024-03-11 ENCOUNTER — Other Ambulatory Visit: Payer: Self-pay

## 2024-03-11 MED ORDER — AMLODIPINE BESYLATE 2.5 MG PO TABS: 2.5000 mg | ORAL_TABLET | Freq: Every day | ORAL | 3 refills | Status: AC

## 2024-03-15 ENCOUNTER — Telehealth: Payer: Self-pay | Admitting: Cardiovascular Disease

## 2024-03-15 NOTE — Telephone Encounter (Signed)
 Patient has covid, calling to see what medication she can take to treat covid. Please advise

## 2024-03-17 NOTE — Telephone Encounter (Signed)
 Called pt to relay message. Pt states "well like I told the nurse or whoever I talked to my doctor died. So I'm good."  Pt disconnected the call.

## 2024-03-28 NOTE — Progress Notes (Deleted)
 Cardiology Clinic Note   Patient Name: Evelyn Williams Date of Encounter: 03/28/2024  Primary Care Provider:  Thurman Flores, MD Primary Cardiologist:  None  Patient Profile    Evelyn Williams 68 year old female presents to the clinic today for review of her echocardiogram.  Past Medical History    Past Medical History:  Diagnosis Date   Abdominal pain, chronic, epigastric 07/05/2013   Anemia    Asthma    none since age 1   Enlarged heart    Gallstones    GERD (gastroesophageal reflux disease)    H/O urinary retention    HLD (hyperlipidemia)    Hypertension    Obesity    Past Surgical History:  Procedure Laterality Date   ABDOMINAL HYSTERECTOMY  1990   Menometrorragia.Endometriosis   APPENDECTOMY     CHOLECYSTECTOMY N/A 05/26/2016   Procedure: LAPAROSCOPIC CHOLECYSTECTOMY;  Surgeon: Alanda Allegra, MD;  Location: [facility];  Service: General;  Laterality: N/A;   COLONOSCOPY  03/04/2011   [id number] internal hemorrhoids/Redundant transverse colon   ESOPHAGOGASTRODUODENOSCOPY N/A 07/11/2013   Procedure: ESOPHAGOGASTRODUODENOSCOPY (EGD);  Surgeon: Alyce Jubilee, MD;  Location: [facility];  Service: Endoscopy;  Laterality: N/A;  1:45   KNEE ARTHROSCOPY Bilateral    TONSILLECTOMY      Allergies  Allergies  Allergen Reactions   Iodinated Contrast Media Anaphylaxis   Shellfish Allergy Anaphylaxis   Codeine Itching   Prilosec [Omeprazole ]     ABDOMINAL PAIN   Latex Rash    History of Present Illness    Evelyn Williams has PMH of cardiomegaly, precordial pain, obesity, hyperlipidemia, and hypertension.  She was seen in follow-up by Dr. Rolm Clos on 11/17/2023.  During that time she was accompanied by her husband.  Her echocardiogram 2021 was normal.  She was concerned about potential worsening of her shortness of breath due to her recent symptoms.  Over the prior year she had experienced increased shortness of breath which was more noticeable with walking around her  two-story house.  She was winded with climbing stairs and walking across rooms.  She noted occasional twinges in her chest.  She noted that they would occur sporadically and were not predictable.  The sensations have been present for about a year.  No lower extremity edema was noted.  She was taking diltiazem  240 mg daily.  She reported a history of high cholesterol.  She was not on statin therapy due to concern for long-term use.  She also noted some puffiness over her eyelids and swelling under her eyes which she attributed to lack of sleep and allergies.  She denied tobacco, EtOH or drug use.  She denied regular exercise she denied family history of heart disease.  Echocardiogram was ordered and was normal.  She presented to the clinic 02/16/24 for follow-up evaluation and stated she got lost on the way to have the appointment.  She reported that she had to stop and ask for directions.  She then rushed to get to the office today.  Initially her blood pressure was very high.  On recheck it was 148/96.  She reported hair loss, sleep disturbances, cough, and auditory buzzing while on losartan .  I discontinued the medication and started amlodipine  2.5 mg daily.  She reported that intermittently she was able to note blood pressures in the 120s over 70s at home.  We reviewed the importance of avoiding secondary causes of hypertension and the importance of weight loss.  We reviewed her echocardiogram and she expressed  understanding.    She presents to the clinic today for follow-up evaluation and states***.  Today she denies chest pain, increased shortness of breath, lower extremity edema.   Home Medications    Prior to Admission medications   Medication Sig Start Date End Date Taking? Authorizing Provider  Cholecalciferol (VITAMIN D3) 125 MCG (5000 UT) CAPS Take 5,000 Units by mouth daily. Patient not taking: Reported on 11/17/2023    [provider]  EPINEPHrine  0.3 mg/0.3 mL IJ SOAJ injection  Inject 0.3 mg into the muscle as needed for anaphylaxis. Patient not taking: Reported on 11/17/2023 11/28/20   Zorita Hiss, NP  losartan  (COZAAR ) 50 MG tablet Take 1 tablet (50 mg total) by mouth daily. 11/17/23   O'NealCathay Clonts, MD  methylcellulose (CITRUCEL) oral powder Take 1 packet by mouth daily. Patient not taking: Reported on 11/17/2023 04/16/22   Janel Medford, MD  Na Sulfate-K Sulfate-Mg Sulf (SUPREP BOWEL PREP KIT) 17.5-3.13-1.6 GM/177ML SOLN Take 1 kit by mouth as directed. Patient not taking: Reported on 11/17/2023 04/16/22   Janel Medford, MD    Family History    Family History  Problem Relation Age of Onset   Lung cancer Mother    Heart failure Mother    Stroke Father    Prostate cancer Father    Hypertension Father    Hyperlipidemia Father    Cancer Brother        rare lymphoma   Colon cancer Neg Hx    Colon polyps Neg Hx    She indicated that her mother is deceased. She indicated that her father is alive. She indicated that her brother is alive. She indicated that her maternal grandmother is deceased. She indicated that her maternal grandfather is deceased. She indicated that her paternal grandmother is deceased. She indicated that her paternal grandfather is deceased. She indicated that both of her daughters are alive. She indicated that the status of her neg hx is unknown.  Social History    Social History   Socioeconomic History   Marital status: Married    Spouse name: Not on file   Number of children: 2   Years of education: Not on file   Highest education level: Not on file  Occupational History   Occupation: retired   Occupation: Retired [employer]  Tobacco Use   Smoking status: Never   Smokeless tobacco: Never  Vaping Use   Vaping status: Never Used  Substance and Sexual Activity   Alcohol use: No   Drug use: No   Sexual activity: Yes    Birth control/protection: Surgical  Other Topics Concern   Not on file  Social History Narrative   Married for  29 years.Retired,previously Runner, broadcasting/film/video.   Social Drivers of Corporate investment banker Strain: Not on file  Food Insecurity: Not on file  Transportation Needs: Not on file  Physical Activity: Not on file  Stress: Not on file  Social Connections: Not on file  Intimate Partner Violence: Not on file     Review of Systems    General:  No chills, fever, night sweats or weight changes.  Cardiovascular:  No chest pain, dyspnea on exertion, edema, orthopnea, palpitations, paroxysmal nocturnal dyspnea. Dermatological: No rash, lesions/masses Respiratory: No cough, dyspnea Urologic: No hematuria, dysuria Abdominal:   No nausea, vomiting, diarrhea, bright red blood per rectum, melena, or hematemesis Neurologic:  No visual changes, wkns, changes in mental status. All other systems reviewed and are otherwise negative except as noted above.  Physical Exam    VS:  There were no vitals taken for this visit. , BMI There is no height or weight on file to calculate BMI. GEN: Well nourished, well developed, in no acute distress. HEENT: normal. Neck: Supple, no JVD, carotid bruits, or masses. Cardiac: RRR, no murmurs, rubs, or gallops. No clubbing, cyanosis, edema.  Radials/DP/PT 2+ and equal bilaterally.  Respiratory:  Respirations regular and unlabored, clear to auscultation bilaterally. GI: Soft, nontender, nondistended, BS + x 4. MS: no deformity or atrophy. Skin: warm and dry, no rash. Neuro:  Strength and sensation are intact. Psych: Normal affect.  Accessory Clinical Findings    Recent Labs: 11/17/2023: BNP 31.4; BUN 12; Creatinine, Ser 0.84; Potassium 4.9; Sodium 141   Recent Lipid Panel    Component Value Date/Time   CHOL 203 (H) 11/17/2023 0933   TRIG 95 11/17/2023 0933   HDL 62 11/17/2023 0933   CHOLHDL 3.3 11/17/2023 0933   CHOLHDL 3.6 08/21/2020 1101   LDLCALC 124 (H) 11/17/2023 0933   LDLCALC 125 (H) 08/21/2020 1101    No BP recorded.  {Refresh Note OR Click here to enter  BP  :1}***    ECG personally reviewed by me today-none today.     Echocardiogram 12/08/2023  IMPRESSIONS   1. Left ventricular ejection fraction by 3D volume is 61 %. The left ventricle has normal function. The left ventricle has no regional wall motion abnormalities. There is mild left ventricular hypertrophy. Left ventricular diastolic parameters are  consistent with Grade I diastolic dysfunction (impaired relaxation). The average left ventricular global longitudinal strain is -18.8 %. The global longitudinal strain is normal. 2. Right ventricular systolic function is normal. The right ventricular size is normal. 3. The mitral valve is normal in structure. No evidence of mitral valve regurgitation. No evidence of mitral stenosis. 4. The aortic valve is normal in structure. Aortic valve regurgitation is not visualized. No aortic stenosis is present. 5. The inferior vena cava is normal in size with greater than 50% respiratory variability, suggesting right atrial pressure of 3 mmHg.  Comparison(s): No significant change from prior study. Prior images reviewed side by side.  FINDINGS Left Ventricle: Left ventricular ejection fraction by 3D volume is 61 %. The left ventricle has normal function. The left ventricle has no regional wall motion abnormalities. The average left ventricular global longitudinal strain is -18.8 %. Strain was performed and the global longitudinal strain is normal. The left ventricular internal cavity size was normal in size. There is mild left ventricular hypertrophy. Left ventricular diastolic parameters are consistent with Grade I diastolic dysfunction  (impaired relaxation).  Right Ventricle: The right ventricular size is normal. No increase in right ventricular wall thickness. Right ventricular systolic function is normal.  Left Atrium: Left atrial size was normal in size.  Right Atrium: Right atrial size was normal in size.  Pericardium: There is no evidence  of pericardial effusion.  Mitral Valve: The mitral valve is normal in structure. No evidence of mitral valve regurgitation. No evidence of mitral valve stenosis.  Tricuspid Valve: The tricuspid valve is normal in structure. Tricuspid valve regurgitation is not demonstrated. No evidence of tricuspid stenosis.  Aortic Valve: The aortic valve is normal in structure. Aortic valve regurgitation is not visualized. No aortic stenosis is present.  Pulmonic Valve: The pulmonic valve was normal in structure. Pulmonic valve regurgitation is not visualized. No evidence of pulmonic stenosis.  Aorta: The aortic root is normal in size and structure.  Venous: The inferior vena  cava is normal in size with greater than 50% respiratory variability, suggesting right atrial pressure of 3 mmHg.  IAS/Shunts: No atrial level shunt detected by color flow Doppler.  Additional Comments: 3D was performed not requiring image post processing on an independent workstation and was normal.    Assessment & Plan   1.  Essential hypertension-BP today 148***/96. 120's systolic at home.  Brings in home blood pressure cuff which is a wrist cuff.  I recommended that she get an Omron upper arm cuff.  I recommended that she take her blood pressure 1 hour after taking her medication.  Previously did not tolerate losartan . Maintain blood pressure log Continue 2.5 mg of Amlodipine  Heart healthy low-sodium diet-salty 6 diet sheet given-reviewed Secondary causes of hypertension reviewed.  Shortness of breath-continues to note some increased work of breathing with increased physical activity.  Echocardiogram reassuring.  Details above. Has lost about 40 lbs previously noted improvement with her breathing. Increase physical activity as tolerated. Continue weight loss Reassured that her symptoms were not related to cardiac issues.  Cardiomegaly-mild LVH noted on echocardiogram.  Echocardiogram reviewed Maintain good blood pressure  control Heart healthy low-sodium diet    Disposition: Follow-up with Dr. Rolm Clos or me in 6 months.  Chet Cota. Pacer Dorn NP-C     03/28/2024, 7:44 AM [facility] [address] Office [phone] Fax [phone]    I spent 15***minutes examining this patient, reviewing medications, and using patient centered shared decision making involving their cardiac care.   I spent  20 minutes reviewing past medical history,  medications, and prior cardiac tests.

## 2024-03-29 ENCOUNTER — Ambulatory Visit: Admitting: General Practice

## 2024-07-04 ENCOUNTER — Encounter: Payer: Self-pay | Admitting: *Deleted

## 2024-07-04 NOTE — Progress Notes (Signed)
 Evelyn Williams                                          MRN: [id number]   07/04/2024   The [facility] Quality Team Specialist reviewed this patient medical record for the purposes of chart review for care gap closure. The following were reviewed: chart review for care gap closure-colorectal cancer screening.    [facility] Quality Team

## 2024-07-11 DIAGNOSIS — M170 Bilateral primary osteoarthritis of knee: Secondary | ICD-10-CM | POA: Diagnosis not present

## 2024-07-11 DIAGNOSIS — M5410 Radiculopathy, site unspecified: Secondary | ICD-10-CM | POA: Diagnosis not present

## 2024-07-11 DIAGNOSIS — M7051 Other bursitis of knee, right knee: Secondary | ICD-10-CM | POA: Diagnosis not present

## 2024-07-11 DIAGNOSIS — Z6841 Body Mass Index (BMI) 40.0 and over, adult: Secondary | ICD-10-CM | POA: Diagnosis not present

## 2024-10-26 ENCOUNTER — Encounter: Payer: Self-pay | Admitting: *Deleted

## 2024-10-26 NOTE — Progress Notes (Signed)
 Evelyn Williams                                          MRN: [id number]   10/26/2024   The [facility] Quality Team Specialist reviewed this patient medical record for the purposes of chart review for care gap closure. The following were reviewed: chart review for care gap closure-controlling blood pressure.    [facility] Quality Team
